# Patient Record
Sex: Male | Born: 2000 | Race: White | Hispanic: No | Marital: Single | Smoking: Never smoker
Health system: Southern US, Community
[De-identification: ages and names within clinical notes are randomized; demographics above are authoritative.]

## PROBLEM LIST (undated history)

## (undated) DIAGNOSIS — N1370 Vesicoureteral-reflux, unspecified: Secondary | ICD-10-CM

## (undated) DIAGNOSIS — Z973 Presence of spectacles and contact lenses: Secondary | ICD-10-CM

## (undated) DIAGNOSIS — G039 Meningitis, unspecified: Secondary | ICD-10-CM

## (undated) DIAGNOSIS — F909 Attention-deficit hyperactivity disorder, unspecified type: Secondary | ICD-10-CM

## (undated) HISTORY — DX: Vesicoureteral-reflux, unspecified: N13.70

## (undated) HISTORY — DX: Attention-deficit hyperactivity disorder, unspecified type: F90.9

## (undated) HISTORY — DX: Meningitis, unspecified: G03.9

## (undated) HISTORY — PX: NO PAST SURGERIES: SHX2092

## (undated) HISTORY — DX: Presence of spectacles and contact lenses: Z97.3

---
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
## 2020-08-26 ENCOUNTER — Telehealth: Payer: BLUE CROSS/BLUE SHIELD | Admitting: Physician Assistant

## 2020-08-26 DIAGNOSIS — L0291 Cutaneous abscess, unspecified: Secondary | ICD-10-CM

## 2020-08-26 MED ORDER — CLINDAMYCIN HCL 150 MG PO CAPS: 450.0000 mg | ORAL_CAPSULE | Freq: Three times a day (TID) | ORAL | 0 refills | Status: AC

## 2020-08-26 NOTE — Progress Notes (Signed)
E Visit for Cellulitis  We are sorry that you are not feeling well. Here is how we plan to help!  Based on what you shared with me it looks like you had an abscess that has turned into cellulitis.  Cellulitis looks like areas of skin redness, swelling, and warmth; it develops as a result of bacteria entering under the skin. Little red spots and/or bleeding can be seen in skin, and tiny surface sacs containing fluid can occur. Fever can be present. Cellulitis is almost always on one side of a body, and the lower limbs are the most common site of involvement.   I have prescribed:  Clindamycin 450 mg take one by mouth three times a day for 5 days  HOME CARE:  . Take your medications as ordered and take all of them, even if the skin irritation appears to be healing.   GET HELP RIGHT AWAY IF:  . Symptoms that don't begin to go away within 48 hours. . Severe redness persists or worsens . If the area turns color, spreads or swells. . If it blisters and opens, develops yellow-brown crust or bleeds. . You develop a fever or chills. . If the pain increases or becomes unbearable.  . Are unable to keep fluids and food down.  MAKE SURE YOU    Understand these instructions.  Will watch your condition.  Will get help right away if you are not doing well or get worse.  Thank you for choosing an e-visit. Your e-visit answers were reviewed by a board certified advanced clinical practitioner to complete your personal care plan. Depending upon the condition, your plan could have included both over the counter or prescription medications. Please review your pharmacy choice. Make sure the pharmacy is open so you can pick up prescription now. If there is a problem, you may contact your provider through [employer] and have the prescription routed to another pharmacy. Your safety is important to [place]. If you have drug allergies check your prescription carefully.  For the next 24 hours you can use  MyChart to ask questions about today's visit, request a non-urgent call back, or ask for a work or school excuse. You will get an email in the next two days asking about your experience. I hope that your e-visit has been valuable and will speed your recovery.   Greater than 5 minutes, yet less than 10 minutes of time have been spent researching, coordinating, and implementing care for this patient today

## 2021-05-08 ENCOUNTER — Ambulatory Visit: Payer: BLUE CROSS/BLUE SHIELD | Admitting: Family Medicine

## 2021-05-22 ENCOUNTER — Telehealth: Payer: Self-pay | Admitting: *Deleted

## 2021-05-22 ENCOUNTER — Ambulatory Visit: Payer: BLUE CROSS/BLUE SHIELD | Admitting: Family Medicine

## 2021-05-22 NOTE — Telephone Encounter (Signed)
Patient rescheduled to 06/08/21

## 2021-05-22 NOTE — Telephone Encounter (Signed)
Caller Name Brysen Shankman Caller Phone Number [phone] Patient Name David Gray Patient DOB 02-Apr-2001 Call Type Message Only Information Provided Reason for Call Request to [facility] Appointment Initial Comment Caller states he has an apt today but he does not feel well, needs to cancel. Patient request to speak to RN No Additional Comment Caller declined triage and office hours were provided. Disp. Time Disposition Final User 05/22/2021 7:14:16 AM General Information Provided Yes Schlegelmilch, Fleet Contras

## 2021-05-29 ENCOUNTER — Telehealth: Payer: Self-pay | Admitting: *Deleted

## 2021-05-29 NOTE — Telephone Encounter (Signed)
Who Is Calling Patient / Member / Family / Caregiver Call Type Triage / Clinical Relationship To Patient Self Return Phone Number [phone] (Primary) Chief Complaint Muscle Jerks, Tics And Shudders Reason for Call Medication Question / Request Initial Comment Caller has an appt scheduled. Caller has ADHD, he is requesting med from any provider. Unable to focusing, tics. Translation No Nurse Assessment Nurse: Elesa Hacker, RN, Nash Dimmer Date/Time (Eastern Time): 05/28/2021 7:01:21 PM Confirm and document reason for call. If symptomatic, describe symptoms. ---Caller has an appt scheduled. Caller has ADHD, he is requesting med from any provider. Unable to focusing, tics.

## 2021-05-29 NOTE — Telephone Encounter (Signed)
Spoke with Dr. Carmelia Roller and he said to put him in for Wednesday 06/03/21.  Patient scheduled for acute visit.

## 2021-06-03 ENCOUNTER — Encounter: Payer: Self-pay | Admitting: Family Medicine

## 2021-06-03 ENCOUNTER — Ambulatory Visit (INDEPENDENT_AMBULATORY_CARE_PROVIDER_SITE_OTHER): Payer: Medicaid Other | Admitting: Family Medicine

## 2021-06-03 ENCOUNTER — Other Ambulatory Visit: Payer: Self-pay

## 2021-06-03 VITALS — BP 118/82 | HR 97 | Temp 98.8°F | Ht 70.0 in | Wt 189.5 lb

## 2021-06-03 DIAGNOSIS — F902 Attention-deficit hyperactivity disorder, combined type: Secondary | ICD-10-CM | POA: Diagnosis not present

## 2021-06-03 DIAGNOSIS — G8929 Other chronic pain: Secondary | ICD-10-CM | POA: Diagnosis not present

## 2021-06-03 DIAGNOSIS — F951 Chronic motor or vocal tic disorder: Secondary | ICD-10-CM | POA: Diagnosis not present

## 2021-06-03 DIAGNOSIS — M545 Low back pain, unspecified: Secondary | ICD-10-CM | POA: Diagnosis not present

## 2021-06-03 MED ORDER — AMPHETAMINE-DEXTROAMPHETAMINE 20 MG PO TABS: 20.0000 mg | ORAL_TABLET | Freq: Every day | ORAL | 0 refills | Status: DC

## 2021-06-03 NOTE — Progress Notes (Signed)
Chief Complaint  Patient presents with  . ADHD  . New Patient (Initial Visit)       New Patient Visit SUBJECTIVE: HPI: David Gray is an 20 y.o.male who is being seen for establishing care.  The patient was previously seen at the [place].  ADHD The patient has a 15-year history of ADHD.  He was diagnosed when he was in first grade by his pediatrician who had him undergo formal evaluation with a psychologist.  He has tried Focalin, Adderall, Ritalin, and another medication which he cannot remember the name.  He got up in the [employer] 1 year ago.  He is taking classes currently and notices his schoolwork is challenging due to poor attention.  He also has some hyperactivity and takes.  He would like to be evaluated.  OCD.  He does partake with some illicit drug use but is willing to stop with restarting medication.  Low back pain The patient has a 1.5-year history of left-sided low back pain.  Lifting will sometimes make it worse.  There is no specific injury that started this.  He has tingling/numbness radiating down the posterior left thigh, but does not extend further than the knee.  No bowel or bladder incontinence.  He does not stretch routinely.  No other neurologic signs or symptoms.  No redness, bruising, or swelling.  Past Medical History:  Diagnosis Date  . ADHD (attention deficit hyperactivity disorder)    dx  at age 26 or 14  . Wears glasses    Past Surgical History:  Procedure Laterality Date  . NO PAST SURGERIES     Family History  Problem Relation Age of Onset  . Bipolar disorder Mother   . Drug abuse Mother   . Cancer Other        GGM breast ca  . Bipolar disorder Father   . Drug abuse Father   . Bipolar disorder Maternal Grandmother   . COPD Maternal Aunt   . Diabetes Neg Hx   . Coronary artery disease Neg Hx   . Colon cancer Neg Hx   . Prostate cancer Neg Hx    No Known Allergies  Current Outpatient Medications:  .  amphetamine-dextroamphetamine (ADDERALL)  20 MG tablet, Take 1 tablet (20 mg total) by mouth daily., Disp: 30 tablet, Rfl: 0  OBJECTIVE: BP 118/82 (BP Location: Left Arm, Patient Position: Sitting, Cuff Size: Normal)   Pulse 97   Temp 98.8 F (37.1 C) (Oral)   Ht 5\' 10"  (1.778 m)   Wt 189 lb 8 oz (86 kg)   SpO2 98%   BMI 27.19 kg/m  General:  well developed, well nourished, in no apparent distress Skin:  no significant moles, warts, or growths Lungs:  clear to auscultation, breath sounds equal bilaterally, no respiratory distress Cardio:  regular rate and rhythm, no LE edema or bruits Musculoskeletal: Tight hamstrings bilaterally, negative straight leg bilaterally, no tenderness to palpation over lumbar paraspinal musculature and erector spinae muscle group, no tenderness over the SI joints Neuro:  gait normal, DTRs equal and symmetric throughout, no clonus, no cerebellar signs Psych: well oriented with normal range of affect and appropriate judgment/insight  ASSESSMENT/PLAN: Attention deficit hyperactivity disorder (ADHD), combined type - Plan: Ambulatory referral to Psychology, amphetamine-dextroamphetamine (ADDERALL) 20 MG tablet  Chronic motor tic - Plan: Ambulatory referral to Psychology  Chronic left-sided low back pain without sciatica  1.  Chronic, uncontrolled.  Start Adderall 20 mg daily.  Stop all illicit drug use.  We will  have him sign a controlled substance contract and give [place] a urine drug screen sample when he is stable on the medication.  Refer to psychology for formal diagnosis of this versus OCD versus autism spectrum issue. 2.  As above 3.  Stretches and exercises provided.  If no improvement, will have him start physical therapy. Patient should return in 1 month to check above. The patient voiced understanding and agreement to the plan.   [name], DO 06/03/21  3:10 PM

## 2021-06-03 NOTE — Patient Instructions (Addendum)
If you do not hear anything about your referral in the next 1-2 weeks, call our office and ask for an update.  Let us know if you need anything.  EXERCISES  RANGE OF MOTION (ROM) AND STRETCHING EXERCISES - Low Back Pain Most people with lower back pain will find that their symptoms get worse with excessive bending forward (flexion) or arching at the lower back (extension). The exercises that will help resolve your symptoms will focus on the opposite motion.  If you have pain, numbness or tingling which travels down into your buttocks, leg or foot, the goal of the therapy is for these symptoms to move closer to your back and eventually resolve. Sometimes, these leg symptoms will get better, but your lower back pain may worsen. This is often an indication of progress in your rehabilitation. Be very alert to any changes in your symptoms and the activities in which you participated in the 24 hours prior to the change. Sharing this information with your caregiver will allow him or her to most efficiently treat your condition. These exercises may help you when beginning to rehabilitate your injury. Your symptoms may resolve with or without further involvement from your physician, physical therapist or athletic trainer. While completing these exercises, remember:   Restoring tissue flexibility helps normal motion to return to the joints. This allows healthier, less painful movement and activity.  An effective stretch should be held for at least 30 seconds.  A stretch should never be painful. You should only feel a gentle lengthening or release in the stretched tissue. FLEXION RANGE OF MOTION AND STRETCHING EXERCISES:  STRETCH - Flexion, Single Knee to Chest   Lie on a firm bed or floor with both legs extended in front of you.  Keeping one leg in contact with the floor, bring your opposite knee to your chest. Hold your leg in place by either grabbing behind your thigh or at your knee.  Pull until you  feel a gentle stretch in your low back. Hold 30 seconds.  Slowly release your grasp and repeat the exercise with the opposite side. Repeat 2 times. Complete this exercise 3 times per week.   STRETCH - Flexion, Double Knee to Chest  Lie on a firm bed or floor with both legs extended in front of you.  Keeping one leg in contact with the floor, bring your opposite knee to your chest.  Tense your stomach muscles to support your back and then lift your other knee to your chest. Hold your legs in place by either grabbing behind your thighs or at your knees.  Pull both knees toward your chest until you feel a gentle stretch in your low back. Hold 30 seconds.  Tense your stomach muscles and slowly return one leg at a time to the floor. Repeat 2 times. Complete this exercise 3 times per week.   STRETCH - Low Trunk Rotation  Lie on a firm bed or floor. Keeping your legs in front of you, bend your knees so they are both pointed toward the ceiling and your feet are flat on the floor.  Extend your arms out to the side. This will stabilize your upper body by keeping your shoulders in contact with the floor.  Gently and slowly drop both knees together to one side until you feel a gentle stretch in your low back. Hold for 30 seconds.  Tense your stomach muscles to support your lower back as you bring your knees back to the starting position.  Repeat the exercise to the other side. Repeat 2 times. Complete this exercise at least 3 times per week.   EXTENSION RANGE OF MOTION AND FLEXIBILITY EXERCISES:  STRETCH - Extension, Prone on Elbows   Lie on your stomach on the floor, a bed will be too soft. Place your palms about shoulder width apart and at the height of your head.  Place your elbows under your shoulders. If this is too painful, stack pillows under your chest.  Allow your body to relax so that your hips drop lower and make contact more completely with the floor.  Hold this position for 30  seconds.  Slowly return to lying flat on the floor. Repeat 2 times. Complete this exercise 3 times per week.   RANGE OF MOTION - Extension, Prone Press Ups  Lie on your stomach on the floor, a bed will be too soft. Place your palms about shoulder width apart and at the height of your head.  Keeping your back as relaxed as possible, slowly straighten your elbows while keeping your hips on the floor. You may adjust the placement of your hands to maximize your comfort. As you gain motion, your hands will come more underneath your shoulders.  Hold this position 30 seconds.  Slowly return to lying flat on the floor. Repeat 2 times. Complete this exercise 3 times per week.   RANGE OF MOTION- Quadruped, Neutral Spine   Assume a hands and knees position on a firm surface. Keep your hands under your shoulders and your knees under your hips. You may place padding under your knees for comfort.  Drop your head and point your tailbone toward the ground below you. This will round out your lower back like an angry cat. Hold this position for 30 seconds.  Slowly lift your head and release your tail bone so that your back sags into a large arch, like an old horse.  Hold this position for 30 seconds.  Repeat this until you feel limber in your low back.  Now, find your "sweet spot." This will be the most comfortable position somewhere between the two previous positions. This is your neutral spine. Once you have found this position, tense your stomach muscles to support your low back.  Hold this position for 30 seconds. Repeat 2 times. Complete this exercise 3 times per week.   STRENGTHENING EXERCISES - Low Back Sprain These exercises may help you when beginning to rehabilitate your injury. These exercises should be done near your "sweet spot." This is the neutral, low-back arch, somewhere between fully rounded and fully arched, that is your least painful position. When performed in this safe range of  motion, these exercises can be used for people who have either a flexion or extension based injury. These exercises may resolve your symptoms with or without further involvement from your physician, physical therapist or athletic trainer. While completing these exercises, remember:   Muscles can gain both the endurance and the strength needed for everyday activities through controlled exercises.  Complete these exercises as instructed by your physician, physical therapist or athletic trainer. Increase the resistance and repetitions only as guided.  You may experience muscle soreness or fatigue, but the pain or discomfort you are trying to eliminate should never worsen during these exercises. If this pain does worsen, stop and make certain you are following the directions exactly. If the pain is still present after adjustments, discontinue the exercise until you can discuss the trouble with your caregiver.  STRENGTHENING -   Deep Abdominals, Pelvic Tilt   Lie on a firm bed or floor. Keeping your legs in front of you, bend your knees so they are both pointed toward the ceiling and your feet are flat on the floor.  Tense your lower abdominal muscles to press your low back into the floor. This motion will rotate your pelvis so that your tail bone is scooping upwards rather than pointing at your feet or into the floor. With a gentle tension and even breathing, hold this position for 3 seconds. Repeat 2 times. Complete this exercise 3 times per week.   STRENGTHENING - Abdominals, Crunches   Lie on a firm bed or floor. Keeping your legs in front of you, bend your knees so they are both pointed toward the ceiling and your feet are flat on the floor. Cross your arms over your chest.  Slightly tip your chin down without bending your neck.  Tense your abdominals and slowly lift your trunk high enough to just clear your shoulder blades. Lifting higher can put excessive stress on the lower back and does not  further strengthen your abdominal muscles.  Control your return to the starting position. Repeat 2 times. Complete this exercise 3 times per week.   STRENGTHENING - Quadruped, Opposite UE/LE Lift   Assume a hands and knees position on a firm surface. Keep your hands under your shoulders and your knees under your hips. You may place padding under your knees for comfort.  Find your neutral spine and gently tense your abdominal muscles so that you can maintain this position. Your shoulders and hips should form a rectangle that is parallel with the floor and is not twisted.  Keeping your trunk steady, lift your right hand no higher than your shoulder and then your left leg no higher than your hip. Make sure you are not holding your breath. Hold this position for 30 seconds.  Continuing to keep your abdominal muscles tense and your back steady, slowly return to your starting position. Repeat with the opposite arm and leg. Repeat 2 times. Complete this exercise 3 times per week.   STRENGTHENING - Abdominals and Quadriceps, Straight Leg Raise   Lie on a firm bed or floor with both legs extended in front of you.  Keeping one leg in contact with the floor, bend the other knee so that your foot can rest flat on the floor.  Find your neutral spine, and tense your abdominal muscles to maintain your spinal position throughout the exercise.  Slowly lift your straight leg off the floor about 6 inches for a count of 3, making sure to not hold your breath.  Still keeping your neutral spine, slowly lower your leg all the way to the floor. Repeat this exercise with each leg 2 times. Complete this exercise 3 times per week.  POSTURE AND BODY MECHANICS CONSIDERATIONS - Low Back Sprain Keeping correct posture when sitting, standing or completing your activities will reduce the stress put on different body tissues, allowing injured tissues a chance to heal and limiting painful experiences. The following are  general guidelines for improved posture.  While reading these guidelines, remember:  The exercises prescribed by your provider will help you have the flexibility and strength to maintain correct postures.  The correct posture provides the best environment for your joints to work. All of your joints have less wear and tear when properly supported by a spine with good posture. This means you will experience a healthier, less painful body.    Correct posture must be practiced with all of your activities, especially prolonged sitting and standing. Correct posture is as important when doing repetitive low-stress activities (typing) as it is when doing a single heavy-load activity (lifting).  RESTING POSITIONS Consider which positions are most painful for you when choosing a resting position. If you have pain with flexion-based activities (sitting, bending, stooping, squatting), choose a position that allows you to rest in a less flexed posture. You would want to avoid curling into a fetal position on your side. If your pain worsens with extension-based activities (prolonged standing, working overhead), avoid resting in an extended position such as sleeping on your stomach. Most people will find more comfort when they rest with their spine in a more neutral position, neither too rounded nor too arched. Lying on a non-sagging bed on your side with a pillow between your knees, or on your back with a pillow under your knees will often provide some relief. Keep in mind, being in any one position for a prolonged period of time, no matter how correct your posture, can still lead to stiffness.  PROPER SITTING POSTURE In order to minimize stress and discomfort on your spine, you must sit with correct posture. Sitting with good posture should be effortless for a healthy body. Returning to good posture is a gradual process. Many people can work toward this most comfortably by using various supports until they have the  flexibility and strength to maintain this posture on their own. When sitting with proper posture, your ears will fall over your shoulders and your shoulders will fall over your hips. You should use the back of the chair to support your upper back. Your lower back will be in a neutral position, just slightly arched. You may place a small pillow or folded towel at the base of your lower back for  support.  When working at a desk, create an environment that supports good, upright posture. Without extra support, muscles tire, which leads to excessive strain on joints and other tissues. Keep these recommendations in mind:  CHAIR:  A chair should be able to slide under your desk when your back makes contact with the back of the chair. This allows you to work closely.  The chair's height should allow your eyes to be level with the upper part of your monitor and your hands to be slightly lower than your elbows.  BODY POSITION  Your feet should make contact with the floor. If this is not possible, use a foot rest.  Keep your ears over your shoulders. This will reduce stress on your neck and low back.  INCORRECT SITTING POSTURES  If you are feeling tired and unable to assume a healthy sitting posture, do not slouch or slump. This puts excessive strain on your back tissues, causing more damage and pain. Healthier options include:  Using more support, like a lumbar pillow.  Switching tasks to something that requires you to be upright or walking.  Talking a brief walk.  Lying down to rest in a neutral-spine position.  PROLONGED STANDING WHILE SLIGHTLY LEANING FORWARD  When completing a task that requires you to lean forward while standing in one place for a long time, place either foot up on a stationary 2-4 inch high object to help maintain the best posture. When both feet are on the ground, the lower back tends to lose its slight inward curve. If this curve flattens (or becomes too large), then the  back and your other joints  will experience too much stress, tire more quickly, and can cause pain.  CORRECT STANDING POSTURES Proper standing posture should be assumed with all daily activities, even if they only take a few moments, like when brushing your teeth. As in sitting, your ears should fall over your shoulders and your shoulders should fall over your hips. You should keep a slight tension in your abdominal muscles to brace your spine. Your tailbone should point down to the ground, not behind your body, resulting in an over-extended swayback posture.   INCORRECT STANDING POSTURES  Common incorrect standing postures include a forward head, locked knees and/or an excessive swayback. WALKING Walk with an upright posture. Your ears, shoulders and hips should all line-up.  PROLONGED ACTIVITY IN A FLEXED POSITION When completing a task that requires you to bend forward at your waist or lean over a low surface, try to find a way to stabilize 3 out of 4 of your limbs. You can place a hand or elbow on your thigh or rest a knee on the surface you are reaching across. This will provide you more stability, so that your muscles do not tire as quickly. By keeping your knees relaxed, or slightly bent, you will also reduce stress across your lower back. CORRECT LIFTING TECHNIQUES  DO :  Assume a wide stance. This will provide you more stability and the opportunity to get as close as possible to the object which you are lifting.  Tense your abdominals to brace your spine. Bend at the knees and hips. Keeping your back locked in a neutral-spine position, lift using your leg muscles. Lift with your legs, keeping your back straight.  Test the weight of unknown objects before attempting to lift them.  Try to keep your elbows locked down at your sides in order get the best strength from your shoulders when carrying an object.     Always ask for help when lifting heavy or awkward objects. INCORRECT  LIFTING TECHNIQUES DO NOT:   Lock your knees when lifting, even if it is a small object.  Bend and twist. Pivot at your feet or move your feet when needing to change directions.  Assume that you can safely pick up even a paperclip without proper posture.

## 2021-06-08 ENCOUNTER — Ambulatory Visit: Payer: BLUE CROSS/BLUE SHIELD | Admitting: Family Medicine

## 2021-06-22 ENCOUNTER — Telehealth: Payer: Self-pay | Admitting: *Deleted

## 2021-06-22 NOTE — Telephone Encounter (Signed)
Who Is Calling Patient / Member / Family / Caregiver Call Type Triage / Clinical Caller Name Domenica Reamer Relationship To Patient Mother Return Phone Number [phone] (Primary) Chief Complaint BREATHING - shortness of breath or sounds breathless Reason for Call Symptomatic / Request for Health Information Initial Comment Caller states her stepson's grandmother passed away in a car accident yesterday. He is having a lot of anxiety, no suicidal thoughts per caller. He has a feeling where he cannot breathe. She is looking for some medication to help him. Translation No Nurse Assessment Nurse: Reasor, RN, Leah Date/Time (Eastern Time): 06/20/2021 10:53:53 AM Confirm and document reason for call. If symptomatic, describe symptoms. ---Caller states pt's grandmother passed away in a car accident yesterday. Caller states pt is having a lot of anxiety, no suicidal thoughts per caller. He has a feeling where he cannot breathe   See pcp in 24 hours

## 2021-07-03 ENCOUNTER — Encounter: Payer: Self-pay | Admitting: Family Medicine

## 2021-07-03 ENCOUNTER — Other Ambulatory Visit: Payer: Self-pay

## 2021-07-03 ENCOUNTER — Ambulatory Visit (INDEPENDENT_AMBULATORY_CARE_PROVIDER_SITE_OTHER): Payer: Medicaid Other | Admitting: Family Medicine

## 2021-07-03 VITALS — BP 102/62 | HR 67 | Temp 98.7°F | Ht 71.0 in | Wt 184.2 lb

## 2021-07-03 DIAGNOSIS — Z79899 Other long term (current) drug therapy: Secondary | ICD-10-CM

## 2021-07-03 DIAGNOSIS — F902 Attention-deficit hyperactivity disorder, combined type: Secondary | ICD-10-CM

## 2021-07-03 MED ORDER — AMPHETAMINE-DEXTROAMPHETAMINE 20 MG PO TABS: 20.0000 mg | ORAL_TABLET | Freq: Every day | ORAL | 0 refills | Status: DC

## 2021-07-03 MED ORDER — AMPHETAMINE-DEXTROAMPHETAMINE 20 MG PO TABS: 20.0000 mg | ORAL_TABLET | Freq: Two times a day (BID) | ORAL | 0 refills | Status: DC

## 2021-07-03 NOTE — Progress Notes (Signed)
Chief Complaint  Patient presents with   Follow-up    medication    David Gray is 20 y.o. male here for ADHD follow up.  Patient is currently on Adderall 20 mg/d and compliance is excellent. Symptoms are well controlled. . Side effects include some tics. Patient believes their dose should be unchanged. Denies weight loss, difficulties with sleep, self-medication, alcohol/drug abuse, chest pain, or palpitations.   Past Medical History:  Diagnosis Date   ADHD (attention deficit hyperactivity disorder)    dx  at age 40 or 5   Wears glasses     BP 102/62   Pulse 67   Temp 98.7 F (37.1 C) (Oral)   Ht 5\' 11"  (1.803 m)   Wt 184 lb 4 oz (83.6 kg)   SpO2 98%   BMI 25.70 kg/m  Gen- awake, alert, appearing stated age Heart- RRR Lungs- CTAB, no accessory muscle use Neuro- no facial tics Psych- age appropriate judgment and insight, normal mood and affect  Attention deficit hyperactivity disorder (ADHD), combined type - Plan: amphetamine-dextroamphetamine (ADDERALL) 20 MG tablet, amphetamine-dextroamphetamine (ADDERALL) 20 MG tablet, amphetamine-dextroamphetamine (ADDERALL) 20 MG tablet  Chronic, stable. Cont Adderall 20 mg/d. CSC and UDS today.  F/u in 6 mo for CPE. Pt voiced understanding and agreement to the plan.  Foxx Klarich Fairplay, DO 07/03/21 2:13 PM

## 2021-07-03 NOTE — Patient Instructions (Signed)
Use Neosporin twice daily when your skin starts to flare up.  Let us know if you need anything.

## 2021-07-08 LAB — DRUG MONITORING, PANEL 8 WITH CONFIRMATION, URINE
6 Acetylmorphine: NEGATIVE ng/mL (ref <10)
Alcohol Metabolites: NEGATIVE ng/mL (ref <500)
Amphetamines: NEGATIVE ng/mL (ref <500)
Benzodiazepines: NEGATIVE ng/mL (ref <100)
Buprenorphine, Urine: NEGATIVE ng/mL (ref <5)
Cocaine Metabolite: NEGATIVE ng/mL (ref <150)
Creatinine: 43.9 mg/dL (ref >=20.0)
MDMA: NEGATIVE ng/mL (ref <500)
Marijuana Metabolite: NEGATIVE ng/mL (ref <20)
Opiates: NEGATIVE ng/mL (ref <100)
Oxidant: NEGATIVE ug/mL (ref <200)
Oxycodone: NEGATIVE ng/mL (ref <100)
pH: 6.4 (ref 4.5–9.0)

## 2021-07-08 LAB — DM TEMPLATE

## 2021-08-07 ENCOUNTER — Emergency Department (HOSPITAL_BASED_OUTPATIENT_CLINIC_OR_DEPARTMENT_OTHER): Payer: Medicaid Other

## 2021-08-07 ENCOUNTER — Other Ambulatory Visit: Payer: Self-pay

## 2021-08-07 ENCOUNTER — Telehealth: Payer: Self-pay | Admitting: *Deleted

## 2021-08-07 ENCOUNTER — Emergency Department (HOSPITAL_BASED_OUTPATIENT_CLINIC_OR_DEPARTMENT_OTHER)
Admission: EM | Admit: 2021-08-07 | Discharge: 2021-08-07 | Disposition: A | Discharge status: Home / Self Care | Payer: Medicaid Other | Attending: Emergency Medicine | Admitting: Emergency Medicine

## 2021-08-07 ENCOUNTER — Encounter (HOSPITAL_BASED_OUTPATIENT_CLINIC_OR_DEPARTMENT_OTHER): Payer: Self-pay | Admitting: Emergency Medicine

## 2021-08-07 DIAGNOSIS — R079 Chest pain, unspecified: Secondary | ICD-10-CM | POA: Diagnosis present

## 2021-08-07 DIAGNOSIS — Z20822 Contact with and (suspected) exposure to covid-19: Secondary | ICD-10-CM | POA: Insufficient documentation

## 2021-08-07 DIAGNOSIS — R0789 Other chest pain: Secondary | ICD-10-CM | POA: Insufficient documentation

## 2021-08-07 LAB — RESP PANEL BY RT-PCR (FLU A&B, COVID) ARPGX2
Influenza A by PCR: NEGATIVE
Influenza B by PCR: NEGATIVE
SARS Coronavirus 2 by RT PCR: NEGATIVE

## 2021-08-07 LAB — TROPONIN I (HIGH SENSITIVITY): Troponin I (High Sensitivity): <2 ng/L (ref <18)

## 2021-08-07 MED ORDER — KETOROLAC TROMETHAMINE 30 MG/ML IJ SOLN
30.0000 mg | Freq: Once | INTRAMUSCULAR | Status: AC
  Administered 2021-08-07: 30 mg via INTRAMUSCULAR
  Filled 2021-08-07: qty 1

## 2021-08-07 NOTE — Telephone Encounter (Signed)
Who Is Calling Patient / Member / Family / Caregiver Call Type Triage / Clinical Relationship To Patient Self Return Phone Number [phone] (Secondary) Chief Complaint CHEST PAIN - pain, pressure, heaviness or tightness Reason for Call Symptomatic / Request for Health Information Initial Comment Caller states he was prescribed Adderall and he is now having chest pain. States he has anxiety and is worried. If he breathes out deep or bend forward he feels it. Translation No Nurse Assessment Nurse: Dante Gang, RN, Dossie Arbour Date/Time (Eastern Time): 08/07/2021 12:32:26 AM Confirm and document reason for call. If symptomatic, describe symptoms. ---Caller states that he was prescribed Adderall and now he is having chest pain. He took the Adderall at 9:00 pm tonight. 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 He has been on it for only about 2 months. This is the first time with the chest pain. He also has Anxiety. Pain is worse than when breathes out deep and when he leans forward. This is a new prescription and the pills look different than the last ones  08/07/2021 12:44:36 AM Go to ED Now (or PCP triage) Yes Dante Gang, RN, Dossie Arbour

## 2021-08-07 NOTE — Telephone Encounter (Signed)
FYI  Patient went to ED 

## 2021-08-07 NOTE — ED Triage Notes (Signed)
Pt states chest pain  left center. started about 10 pm 8/11. No injury no cough. States took adderall. 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 States has tingling in central left of mid back.
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
## 2021-09-21 ENCOUNTER — Other Ambulatory Visit: Payer: Self-pay

## 2021-09-21 ENCOUNTER — Encounter (HOSPITAL_BASED_OUTPATIENT_CLINIC_OR_DEPARTMENT_OTHER): Payer: Self-pay

## 2021-09-21 DIAGNOSIS — Z5321 Procedure and treatment not carried out due to patient leaving prior to being seen by health care provider: Secondary | ICD-10-CM | POA: Diagnosis not present

## 2021-09-21 DIAGNOSIS — R109 Unspecified abdominal pain: Secondary | ICD-10-CM | POA: Diagnosis present

## 2021-09-21 LAB — COMPREHENSIVE METABOLIC PANEL
ALT: 19 U/L (ref 0–44)
AST: 18 U/L (ref 15–41)
Albumin: 4.6 g/dL (ref 3.5–5.0)
Alkaline Phosphatase: 67 U/L (ref 38–126)
Anion gap: 8 (ref 5–15)
BUN: 11 mg/dL (ref 6–20)
CO2: 30 mmol/L (ref 22–32)
Calcium: 9.6 mg/dL (ref 8.9–10.3)
Chloride: 98 mmol/L (ref 98–111)
Creatinine, Ser: 1.04 mg/dL (ref 0.61–1.24)
GFR, Estimated: >60 mL/min (ref >60)
Glucose, Bld: 103 mg/dL — ABNORMAL HIGH (ref 70–99)
Potassium: 4.1 mmol/L (ref 3.5–5.1)
Sodium: 136 mmol/L (ref 135–145)
Total Bilirubin: 0.6 mg/dL (ref 0.3–1.2)
Total Protein: 8.4 g/dL — ABNORMAL HIGH (ref 6.5–8.1)

## 2021-09-21 LAB — CBC
HCT: 46.6 % (ref 39.0–52.0)
Hemoglobin: 16.3 g/dL (ref 13.0–17.0)
MCH: 29.3 pg (ref 26.0–34.0)
MCHC: 35 g/dL (ref 30.0–36.0)
MCV: 83.8 fL (ref 80.0–100.0)
Platelets: 277 10*3/uL (ref 150–400)
RBC: 5.56 MIL/uL (ref 4.22–5.81)
RDW: 13.3 % (ref 11.5–15.5)
WBC: 9.3 10*3/uL (ref 4.0–10.5)
nRBC: 0 % (ref 0.0–0.2)

## 2021-09-21 LAB — LIPASE, BLOOD: Lipase: 33 U/L (ref 11–51)

## 2021-09-21 NOTE — ED Triage Notes (Signed)
Pt c/o abd pain x 1 week-denies v/d-NAD-steady gait

## 2021-09-22 ENCOUNTER — Emergency Department (HOSPITAL_BASED_OUTPATIENT_CLINIC_OR_DEPARTMENT_OTHER)
Admission: EM | Admit: 2021-09-22 | Discharge: 2021-09-22 | Disposition: A | Discharge status: Home / Self Care | Payer: Medicaid Other | Attending: Emergency Medicine | Admitting: Emergency Medicine

## 2021-09-29 ENCOUNTER — Other Ambulatory Visit: Payer: Self-pay | Admitting: Family Medicine

## 2021-09-29 DIAGNOSIS — F902 Attention-deficit hyperactivity disorder, combined type: Secondary | ICD-10-CM

## 2021-09-29 MED ORDER — AMPHETAMINE-DEXTROAMPHETAMINE 20 MG PO TABS: 20.0000 mg | ORAL_TABLET | Freq: Two times a day (BID) | ORAL | 0 refills | Status: DC

## 2021-09-29 MED ORDER — AMPHETAMINE-DEXTROAMPHETAMINE 20 MG PO TABS: 20.0000 mg | ORAL_TABLET | Freq: Every day | ORAL | 0 refills | Status: DC

## 2021-09-29 NOTE — Telephone Encounter (Signed)
Medication:  amphetamine-dextroamphetamine (ADDERALL) 20 MG tablet   Has the patient contacted their pharmacy? No. (If no, request that the patient contact the pharmacy for the refill.) (If yes, when and what did the pharmacy advise?)    Preferred Pharmacy (with phone number or street name):  [id number] - [place], [place] - [address]  [address], [place] [address]  Phone:  [phone]  Fax:  [phone]  

## 2021-09-29 NOTE — Telephone Encounter (Signed)
Requesting:addreall Contract:07/14/2021 UDS:07/03/2021 Last Visit:07/03/2021 Next Visit:01/05/2022 Last Refill:09/01/2021  Please Advise

## 2021-10-01 ENCOUNTER — Telehealth: Payer: Self-pay | Admitting: Family Medicine

## 2021-10-01 NOTE — Telephone Encounter (Signed)
Spoke with the pharmacy and they said its a nationwide backorder , and they haven't had any in the past 2 weeks   Alternatives:  Adderrall Time released capsules & ritalin

## 2021-10-01 NOTE — Telephone Encounter (Signed)
Patient called on 10.04 for med refill amphetamine-dextroamphetamine (ADDERALL) 20 MG tablet. Med request was submitted. However there is a shortage within the pharmacy & advised patient to request alternate meds.   [id number] - [place], [place] - [address] AT [place] [address]  [address], [place] [address]  Phone:  [phone]  Fax:  [phone]

## 2021-10-02 MED ORDER — METHYLPHENIDATE HCL 10 MG PO TABS: 10.0000 mg | ORAL_TABLET | Freq: Every day | ORAL | 0 refills | Status: DC

## 2021-12-29 ENCOUNTER — Telehealth: Payer: Self-pay | Admitting: Family Medicine

## 2021-12-29 DIAGNOSIS — F902 Attention-deficit hyperactivity disorder, combined type: Secondary | ICD-10-CM

## 2021-12-29 MED ORDER — AMPHETAMINE-DEXTROAMPHETAMINE 20 MG PO TABS: 20.0000 mg | ORAL_TABLET | Freq: Every day | ORAL | 0 refills | Status: DC

## 2021-12-29 MED ORDER — AMPHETAMINE-DEXTROAMPHETAMINE 20 MG PO TABS: 20.0000 mg | ORAL_TABLET | Freq: Two times a day (BID) | ORAL | 0 refills | Status: DC

## 2021-12-29 NOTE — Telephone Encounter (Signed)
Last Refill on 11/28/2021 Last OV---07/03/21 Next upcoming scheduled OV---01/05/2022

## 2021-12-29 NOTE — Telephone Encounter (Signed)
Medication:  °amphetamine-dextroamphetamine (ADDERALL) 20 MG tablet  °Has the patient contacted their pharmacy? No. °(If no, request that the patient contact the pharmacy for the refill.) °(If yes, when and what did the pharmacy advise?) ° °Preferred Pharmacy (with phone number or street name):  °[id number] - [place], [place] - [address]  °[address], [place] [address]  °Phone:  [phone]  Fax:  [phone]  ° °Agent: Please be advised that RX refills may take up to 3 business days. We ask that you follow-up with your pharmacy.  °

## 2022-01-05 ENCOUNTER — Encounter: Payer: Self-pay | Admitting: Family Medicine

## 2022-03-01 ENCOUNTER — Telehealth: Payer: Self-pay | Admitting: Family Medicine

## 2022-03-01 ENCOUNTER — Telehealth: Payer: Self-pay

## 2022-03-01 MED ORDER — METHYLPHENIDATE HCL ER (LA) 20 MG PO CP24: 20.0000 mg | ORAL_CAPSULE | ORAL | 0 refills | Status: DC

## 2022-03-01 NOTE — Telephone Encounter (Signed)
PA approved.  ? ?Request Reference Number: [id number]. METHYLPHENID CAP 20MG  ER is approved through 03/02/2023. For further questions, call 05/02/2023 at [phone]. ?

## 2022-03-01 NOTE — Telephone Encounter (Signed)
PA initiated via Covermymeds; KEY: [id number]. Awaiting determination.  ?

## 2022-03-01 NOTE — Telephone Encounter (Signed)
Patient states his pharmacy is out of Adderall and would like to know if something else can be sent. Please advise.  ? ?[id number] - [place], [place] - [address]  ?[address], [place] [address]  ?Phone:  [phone]  Fax:  [phone] ?

## 2022-03-15 ENCOUNTER — Telehealth: Payer: Medicaid Other | Admitting: Physician Assistant

## 2022-03-15 DIAGNOSIS — J019 Acute sinusitis, unspecified: Secondary | ICD-10-CM | POA: Diagnosis not present

## 2022-03-15 DIAGNOSIS — J208 Acute bronchitis due to other specified organisms: Secondary | ICD-10-CM | POA: Diagnosis not present

## 2022-03-15 DIAGNOSIS — B9689 Other specified bacterial agents as the cause of diseases classified elsewhere: Secondary | ICD-10-CM | POA: Diagnosis not present

## 2022-03-15 MED ORDER — BENZONATATE 100 MG PO CAPS
100.0000 mg | ORAL_CAPSULE | Freq: Three times a day (TID) | ORAL | 0 refills | Status: DC | PRN
Start: 2022-03-15 — End: 2022-04-05

## 2022-03-15 MED ORDER — DOXYCYCLINE HYCLATE 100 MG PO TABS: 100.0000 mg | ORAL_TABLET | Freq: Two times a day (BID) | ORAL | 0 refills | Status: DC

## 2022-03-15 MED ORDER — PREDNISONE 10 MG (21) PO TBPK: ORAL_TABLET | ORAL | 0 refills | Status: DC

## 2022-03-15 NOTE — Progress Notes (Signed)
We are sorry that you are not feeling well.  Here is how we plan to help! ? ?Based on your presentation I believe you most likely have A cough due to bacteria.  When patients have a fever and a productive cough with a change in color or increased sputum production, we are concerned about bacterial bronchitis.  If left untreated it can progress to pneumonia.  If your symptoms do not improve with your treatment plan it is important that you contact your provider.   I have prescribed Doxycycline 100 mg twice a day for 7 days   ? ?I do believe you may also have a sinus infection. The above antibiotic will cover both. ?  ?In addition you may use A prescription cough medication called Tessalon Perles 100mg . You may take 1-2 capsules every 8 hours as needed for your cough. ? ?Prednisone 10 mg daily for 6 days (see taper instructions below) ? ?Directions for 6 day taper: ?Day 1: 2 tablets before breakfast, 1 after both lunch & dinner and 2 at bedtime ?Day 2: 1 tab before breakfast, 1 after both lunch & dinner and 2 at bedtime ?Day 3: 1 tab at each meal & 1 at bedtime ?Day 4: 1 tab at breakfast, 1 at lunch, 1 at bedtime ?Day 5: 1 tab at breakfast & 1 tab at bedtime ?Day 6: 1 tab at breakfast ? ?From your responses in the eVisit questionnaire you describe inflammation in the upper respiratory tract which is causing a significant cough.  This is commonly called Bronchitis and has four common causes:   ?Allergies ?Viral Infections ?Acid Reflux ?Bacterial Infection ?Allergies, viruses and acid reflux are treated by controlling symptoms or eliminating the cause. An example might be a cough caused by taking certain blood pressure medications. You stop the cough by changing the medication. Another example might be a cough caused by acid reflux. Controlling the reflux helps control the cough. ? ?USE OF BRONCHODILATOR ("RESCUE") INHALERS: ?There is a risk from using your bronchodilator too frequently.  The risk is that over-reliance  on a medication which only relaxes the muscles surrounding the breathing tubes can reduce the effectiveness of medications prescribed to reduce swelling and congestion of the tubes themselves.  Although you feel brief relief from the bronchodilator inhaler, your asthma may actually be worsening with the tubes becoming more swollen and filled with mucus.  This can delay other crucial treatments, such as oral steroid medications. If you need to use a bronchodilator inhaler daily, several times per day, you should discuss this with your provider.  There are probably better treatments that could be used to keep your asthma under control.  ?   ?HOME CARE ?Only take medications as instructed by your medical team. ?Complete the entire course of an antibiotic. ?Drink plenty of fluids and get plenty of rest. ?Avoid close contacts especially the very young and the elderly ?Cover your mouth if you cough or cough into your sleeve. ?Always remember to wash your hands ?A steam or ultrasonic humidifier can help congestion.  ? ?GET HELP RIGHT AWAY IF: ?You develop worsening fever. ?You become short of breath ?You cough up blood. ?Your symptoms persist after you have completed your treatment plan ?MAKE SURE YOU  ?Understand these instructions. ?Will watch your condition. ?Will get help right away if you are not doing well or get worse. ?  ? ?Thank you for choosing an e-visit. ? ?Your e-visit answers were reviewed by a board certified advanced clinical practitioner to  complete your personal care plan. Depending upon the condition, your plan could have included both over the counter or prescription medications. ? ?Please review your pharmacy choice. Make sure the pharmacy is open so you can pick up prescription now. If there is a problem, you may contact your provider through [employer] and have the prescription routed to another pharmacy.  Your safety is important to [place]. If you have drug allergies check your prescription  carefully.  ? ?For the next 24 hours you can use MyChart to ask questions about today's visit, request a non-urgent call back, or ask for a work or school excuse. ?You will get an email in the next two days asking about your experience. I hope that your e-visit has been valuable and will speed your recovery. ? ?I provided 5 minutes of non face-to-face time during this encounter for chart review and documentation.  ? ?

## 2022-03-16 MED ORDER — PROMETHAZINE-DM 6.25-15 MG/5ML PO SYRP: 5.0000 mL | ORAL_SOLUTION | Freq: Four times a day (QID) | ORAL | 0 refills | Status: DC | PRN

## 2022-03-16 NOTE — Addendum Note (Signed)
Addended by: Margaretann Loveless on: 03/16/2022 05:53 PM ? ? Modules accepted: Orders ? ?

## 2022-04-04 ENCOUNTER — Other Ambulatory Visit: Payer: Self-pay | Admitting: Family Medicine

## 2022-04-04 DIAGNOSIS — F902 Attention-deficit hyperactivity disorder, combined type: Secondary | ICD-10-CM

## 2022-04-05 ENCOUNTER — Other Ambulatory Visit: Payer: Self-pay | Admitting: Family Medicine

## 2022-04-05 ENCOUNTER — Telehealth: Payer: Self-pay | Admitting: Family Medicine

## 2022-04-05 DIAGNOSIS — F902 Attention-deficit hyperactivity disorder, combined type: Secondary | ICD-10-CM

## 2022-04-05 MED ORDER — AMPHETAMINE-DEXTROAMPHETAMINE 20 MG PO TABS: 20.0000 mg | ORAL_TABLET | Freq: Every day | ORAL | 0 refills | Status: DC

## 2022-04-05 NOTE — Telephone Encounter (Signed)
Called and scheduled appt

## 2022-04-05 NOTE — Telephone Encounter (Signed)
30 d sent, due for 6 mo visit. Ty.  ?

## 2022-04-05 NOTE — Telephone Encounter (Signed)
Last OV--07/03/21 ?Last RF--02/17/22 ?UDS--02/17/22 ?

## 2022-04-05 NOTE — Telephone Encounter (Signed)
Medication:  ?amphetamine-dextroamphetamine (ADDERALL) 20 MG tablet [[id number]]  ?  ? ?Has the patient contacted their pharmacy? No. ?(If no, request that the patient contact the pharmacy for the refill.) ?(If yes, when and what did the pharmacy advise?) ? ?  ? ?Preferred Pharmacy (with phone number or street name):  ?[id number] - [place], [place] - [address]  ?[address], [place] [address]  ?Phone:  [phone]  Fax:  [phone]  ?  ? ?Agent: Please be advised that RX refills may take up to 3 business days. We ask that you follow-up with your pharmacy.  ? ?Pt stated if pharmacy is out of the medication to please send alternate medication. ?

## 2022-04-05 NOTE — Telephone Encounter (Signed)
Request sent in earlier ? ?

## 2022-04-09 ENCOUNTER — Encounter: Payer: Self-pay | Admitting: Family Medicine

## 2022-04-09 ENCOUNTER — Ambulatory Visit: Payer: Medicaid Other | Admitting: Family Medicine

## 2022-04-28 ENCOUNTER — Telehealth: Payer: Self-pay | Admitting: Family Medicine

## 2022-04-28 NOTE — Telephone Encounter (Signed)
Medication: amphetamine-dextroamphetamine (ADDERALL) 20 MG tablet  ? ?Has the patient contacted their pharmacy? Yes.   ? ? ?Preferred Pharmacy:  ?[id number] - [place], [place] - [address]  ? [address], [place] [address]  ?Phone:  [phone]  Fax:  [phone]  ?

## 2022-04-28 NOTE — Telephone Encounter (Signed)
Patient informed. 

## 2022-04-28 NOTE — Telephone Encounter (Signed)
Last OV--07/03/2021 ?Next upcoming scheduled appointment is on 05/03/2022 ?Last RF--01/28/2022--#30 no refills ?UDS--07/03/2021 ?

## 2022-04-28 NOTE — Telephone Encounter (Signed)
His last refill was on 4/10 so he won't need a refill prior to his visit with me. Ty.  ?

## 2022-05-03 ENCOUNTER — Encounter: Payer: Self-pay | Admitting: Family Medicine

## 2022-05-03 ENCOUNTER — Ambulatory Visit (INDEPENDENT_AMBULATORY_CARE_PROVIDER_SITE_OTHER): Payer: Medicaid Other | Admitting: Family Medicine

## 2022-05-03 VITALS — BP 110/78 | HR 70 | Temp 98.2°F | Ht 71.0 in | Wt 195.4 lb

## 2022-05-03 DIAGNOSIS — G8929 Other chronic pain: Secondary | ICD-10-CM

## 2022-05-03 DIAGNOSIS — M545 Low back pain, unspecified: Secondary | ICD-10-CM | POA: Diagnosis not present

## 2022-05-03 DIAGNOSIS — F902 Attention-deficit hyperactivity disorder, combined type: Secondary | ICD-10-CM

## 2022-05-03 DIAGNOSIS — S838X1A Sprain of other specified parts of right knee, initial encounter: Secondary | ICD-10-CM | POA: Insufficient documentation

## 2022-05-03 MED ORDER — AMPHETAMINE-DEXTROAMPHETAMINE 20 MG PO TABS: 20.0000 mg | ORAL_TABLET | Freq: Every day | ORAL | 0 refills | Status: DC

## 2022-05-03 NOTE — Progress Notes (Signed)
Chief Complaint  ?Patient presents with  ? Follow-up  ?  6 month ?  ? ? ?David Gray is 21 y.o. male here for ADHD follow up. ? ?Patient is currently on Adderall 20 mg/d and compliance is excellent. ?Symptoms are well controlled.  ?Side effects include: None. ?Patient believes their dose should be not significantly changed. ?Denies common tics, weight loss, difficulties with sleep, self-medication, alcohol/drug abuse, chest pain, or palpitations. ? ?Patient reports he tore his meniscus on the right knee in high school while wrestling.  He started to run again and started to have inner knee pain over that same area.  There is no other injury or change in activity attributable.  He denies any catching or locking.  There is no swelling, redness, or bruising.  No neurologic signs or symptoms. ? ?He also notes a history of bulging disc.  No issues lifting but does have pain when he stands for long periods of time. ? ?Past Medical History:  ?Diagnosis Date  ? ADHD (attention deficit hyperactivity disorder)   ? dx  at age 60 or 5  ? ? ?BP 110/78   Pulse 70   Temp 98.2 ?F (36.8 ?C) (Oral)   Ht 5\' 11"  (1.803 m)   Wt 195 lb 6 oz (88.6 kg)   SpO2 97%   BMI 27.25 kg/m?  ?Gen- awake, alert, appearing stated age ?Heart- RRR ?Lungs- CTAB, no accessory muscle use ?Neuro- no facial tics, gait is normal ?MSK-normal active and passive range of motion, no deformity or edema/effusion, mild TTP over the anterior medial joint line, negative Stines, Lachman's, varus/valgus stress, patellar apprehension/grind ?8- age appropriate judgment and insight, normal mood and affect ? ?Attention deficit hyperactivity disorder (ADHD), combined type ? ?Meniscal injury, right, initial encounter ? ?Chronic low back pain without sciatica, unspecified back pain laterality ? ?Chronic, stable.  Continue Adderall 20 mg daily. ?Chronic, unstable.  Tylenol and ibuprofen as needed.  Stretches and exercises for the meniscus.  He does not have  any signs or symptoms concerning for a bucket-handle tear or anything necessitating surgery.  Will consider physical therapy if no improvement. ?Stretches and exercises for this as well.  Physical therapy if no improvement. ?F/u in 6 mo for CPE. ?Pt voiced understanding and agreement to the plan. ? ?Apolo Cutshaw, DO ?05/03/22 ?4:35 PM ? ? ?

## 2022-05-03 NOTE — Patient Instructions (Signed)
Ice/cold pack over area for 10-15 min twice daily. ? ?Heat (pad or rice pillow in microwave) over affected area, 10-15 minutes twice daily.  ? ?OK to take Tylenol 1000 mg (2 extra strength tabs) or 975 mg (3 regular strength tabs) every 6 hours as needed. ? ?Let us know if you need anything. ? ?Stretching and range of motion exercises ?These exercises warm up your muscles and joints and improve the movement and flexibility of your knee. These exercises also help to relieve pain and stiffness. ? ?Exercise A: Knee flexion, active ?Lie on your back with both knees straight. If this causes back discomfort, bend your uninjured knee so your foot is flat on the floor. ?Slowly slide your left / right heel back toward your buttocks until you feel a gentle stretch in the front of your knee or thigh. Stop if you have pain. ?Hold for3 seconds. ?Slowly slide your left / right heel back to the starting position. 10 total repetitions. ?Repeat 2 times. Complete this exercise 3 times a week. ? ?Exercise B: Knee extension, sitting ?Sit with your left / right heel propped on a chair, a coffee table, or a footstool. Do not have anything under your knee to support it. ?Allow your leg muscles to relax, letting gravity straighten out your knee. You should feel a stretch behind your left / right knee. ?If told by your health care provide just above your kneecap. ?Hold this position for 3 seconds. ?Repeat for a total of 10 repetitions. ?Repeat 2 times. Complete this stretch 3 times a week. ? ?Strengthening exercises ?These exercises build strength and endurance in your knee. Endurance is the ability to use your muscles for a long time, even after they get tired. ? ?Exercise C: Quadriceps, isometric ?Lie on your back with your left / right leg extended and your other knee bent. Put a rolled towel or small pillow under your right/left knee if told by your health care provider. ?Slowly tense the muscles in the front of your left / right thigh  by pushing the back of your knee down. You should see your knee cap slide up toward your hip or see increased dimpling just above the knee. ?For 3 seconds, keep the muscle as tight as you can without increasing your pain. ?Relax the muscles slowly and completely. Repeat for 10 total repetitions. ?Repeat 2 times. Complete this exercise 3 times a week. ?Exercise D: Straight leg raises (quadriceps) ?Lie on your back with your left / right leg extended and your other knee bent. ?Tense the muscles in the front of your left / right thigh. You should see your kneecap slide up or see increased dimpling just above the knee. ?Keep these muscles tight as you raise your leg 4-6 inches (10-15 cm) off the floor. ?Hold this position for 3 seconds. ?Keep these muscles tense as you lower your leg. ?Relax the muscles slowly and completely. Repeat for a total of 10 repetitions. ?Repeat 2 times. Complete this exercise 3 times a week. ? ?Exercise E: Hamstring curls ?On the floor or a bed, lie on your abdomen with your legs straight. Put a folded towel or small pillow under your left / right thigh, just above your kneecap. ?Slowly bend your left / right knee as far as you can without pain. Keep your hips flat against the floor or bed. ?Hold this position for 3 seconds. ?Slowly lower your leg to the starting position. Repeat for a total of 10 repetitions. ?Repeat 2 times. Complete  this exercise 3 times per week. ? ?Stretching exercises ?These exercises warm up your muscles and joints and improve the movement and flexibility of your knee. These exercises also help to relieve pain and stiffness. ? ?Exercise A: Quadriceps, prone ?Lie on your abdomen on a firm surface, such as a bed or padded floor. ?Bend your left / right knee and hold your ankle. If you cannot reach your ankle or pant leg, loop a belt around your foot and grab the belt instead. ?Gently pull your heel toward your buttocks. Your knee should not slide out to the side. You  should feel a stretch in the front of your thigh and knee. ?Hold this position for 30 seconds. ?Repeat 2 times. Complete this stretch 3 times a week. ? ?Exercise B: Hamstring, doorway ?Lie on your back in front of a doorway with your left / right leg resting against the wall and your other leg flat on the floor in the doorway. There should be a slight bend in your left / right knee. ?Straighten your left / right knee. You should feel a stretch behind your knee or thigh. If you do not feel that stretch, scoot your buttocks closer to the door. ?Hold this position for 30 seconds. ?Repeat 2 times. Complete this stretch 3 times a week. ? ?Strengthening exercises ?These exercises build strength and endurance in your knee and leg muscles. Endurance is the ability to use your muscles for a long time, even after they get tired. ? ? ?Exercise D: Wall slides (quadriceps) ?Lean your back against a smooth wall or door, and walk your feet out 18-24 inches (45-61 cm) from it. ?Place your feet hip-width apart. ?Slowly slide down the wall or door until your knees bend 90 degrees. Keep your knees over your heels, not over your toes. Keep your knees in line with your hips. ?Hold for 2 seconds. ?Stand up to rest for 60 seconds. ?Repeat 2 times. Complete this exercise 3 times a week. ? ?Exercise E: Bridge (hip extensors) ?Lie on your back on a firm surface with your knees bent and your feet flat on the floor. ?Tighten your buttocks muscles and lift your bottom off the floor until your trunk is level with your thighs. ?Do not arch your back. ?You should feel the muscles working in your buttocks and the back of your thighs. ?Hold this position for 2 seconds. ?Slowly lower your hips to the starting position. ?Let your buttocks muscles relax completely between repetitions. ?Repeat 2 times. Complete this exercise 3 times a week. ? ?EXERCISES  ?RANGE OF MOTION (ROM) AND STRETCHING EXERCISES - Low Back Pain ?Most people with lower back pain  will find that their symptoms get worse with excessive bending forward (flexion) or arching at the lower back (extension). The exercises that will help resolve your symptoms will focus on the opposite motion.  ?If you have pain, numbness or tingling which travels down into your buttocks, leg or foot, the goal of the therapy is for these symptoms to move closer to your back and eventually resolve. Sometimes, these leg symptoms will get better, but your lower back pain may worsen. This is often an indication of progress in your rehabilitation. Be very alert to any changes in your symptoms and the activities in which you participated in the 24 hours prior to the change. Sharing this information with your caregiver will allow him or her to most efficiently treat your condition. ?These exercises may help you when beginning to rehabilitate your  injury. Your symptoms may resolve with or without further involvement from your physician, physical therapist or athletic trainer. While completing these exercises, remember:  ?Restoring tissue flexibility helps normal motion to return to the joints. This allows healthier, less painful movement and activity. ?An effective stretch should be held for at least 30 seconds. ?A stretch should never be painful. You should only feel a gentle lengthening or release in the stretched tissue. ?FLEXION RANGE OF MOTION AND STRETCHING EXERCISES: ? ?STRETCH - Flexion, Single Knee to Chest  ?Lie on a firm bed or floor with both legs extended in front of you. ?Keeping one leg in contact with the floor, bring your opposite knee to your chest. Hold your leg in place by either grabbing behind your thigh or at your knee. ?Pull until you feel a gentle stretch in your low back. Hold 30 seconds. ?Slowly release your grasp and repeat the exercise with the opposite side. ?Repeat 2 times. Complete this exercise 3 times per week.  ? ?STRETCH - Flexion, Double Knee to Chest ?Lie on a firm bed or floor with both  legs extended in front of you. ?Keeping one leg in contact with the floor, bring your opposite knee to your chest. ?Tense your stomach muscles to support your back and then lift your other knee to your che

## 2022-05-10 ENCOUNTER — Ambulatory Visit (INDEPENDENT_AMBULATORY_CARE_PROVIDER_SITE_OTHER): Payer: Medicaid Other | Admitting: Family Medicine

## 2022-05-10 ENCOUNTER — Encounter: Payer: Self-pay | Admitting: Family Medicine

## 2022-05-10 VITALS — BP 140/60 | HR 87 | Temp 97.8°F | Ht 71.0 in | Wt 193.6 lb

## 2022-05-10 DIAGNOSIS — K140 Glossitis: Secondary | ICD-10-CM

## 2022-05-10 MED ORDER — LIDOCAINE VISCOUS HCL 2 % MT SOLN: 5.0000 mL | OROMUCOSAL | 0 refills | Status: DC | PRN

## 2022-05-10 NOTE — Patient Instructions (Addendum)
This can take 2-3 weeks to heal.  ? ?Cold and less savory foods may be better.  ? ?Ibuprofen 400-600 mg (2-3 over the counter strength tabs) every 6 hours as needed for pain. ? ?OK to take Tylenol 1000 mg (2 extra strength tabs) or 975 mg (3 regular strength tabs) every 6 hours as needed. ? ?Cory Munch is an alternative to what I sent in.  ? ?Let us know if you need anything. ?

## 2022-05-10 NOTE — Progress Notes (Signed)
Chief Complaint  ?Patient presents with  ? tongue pain   ?  Going on 1.5 days  ?Cut his tongue with teeth   ? ? ?David Gray is a 21 y.o. male here for a skin complaint. ? ?Duration: 2 days ?Location: Tip of tongue ?Pruritic? No ?Painful? Yes ?Drainage? No ?Other associated symptoms: rubbed it over a sharp part of tooth and cut it ?Therapies tried thus far: salt water and peroxide wash ? ?Past Medical History:  ?Diagnosis Date  ? ADHD (attention deficit hyperactivity disorder)   ? dx  at age 84 or 5  ? ? ?BP 140/60   Pulse 87   Temp 97.8 ?F (36.6 ?C) (Oral)   Ht 5\' 11"  (1.803 m)   Wt 193 lb 9.6 oz (87.8 kg)   SpO2 97%   BMI 27.00 kg/m?  ?Gen: awake, alert, appearing stated age ?Lungs: No accessory muscle use ?Mouth: MMM, tip of tongue there is an ulceration measuring approx 0.4 x 0.2 cm in diameter with erythematous rim. No sig soft tissue edema or drainage noted ?Psych: Age appropriate judgment and insight ? ?Ulceration, tongue traumatic - Plan: lidocaine (XYLOCAINE) 2 % solution ? ?Avoid aggravating foods. Cold items may be better. Can take 2-3 weeks to fully heal. Ibuprofen/Tylenol. Consider Orajel as alt to above sol'n.  ?F/u prn. ?The patient voiced understanding and agreement to the plan. ? ?Breyon Sigg, DO ?05/10/22 ?12:03 PM ? ?

## 2022-07-22 IMAGING — DX DG CHEST 2V
2 series · 2 of 2 positions shown · non-contrast
Comparison: None.

CLINICAL DATA: Pleuritic left chest pain

EXAM:
CHEST - 2 VIEW

[chest pa]
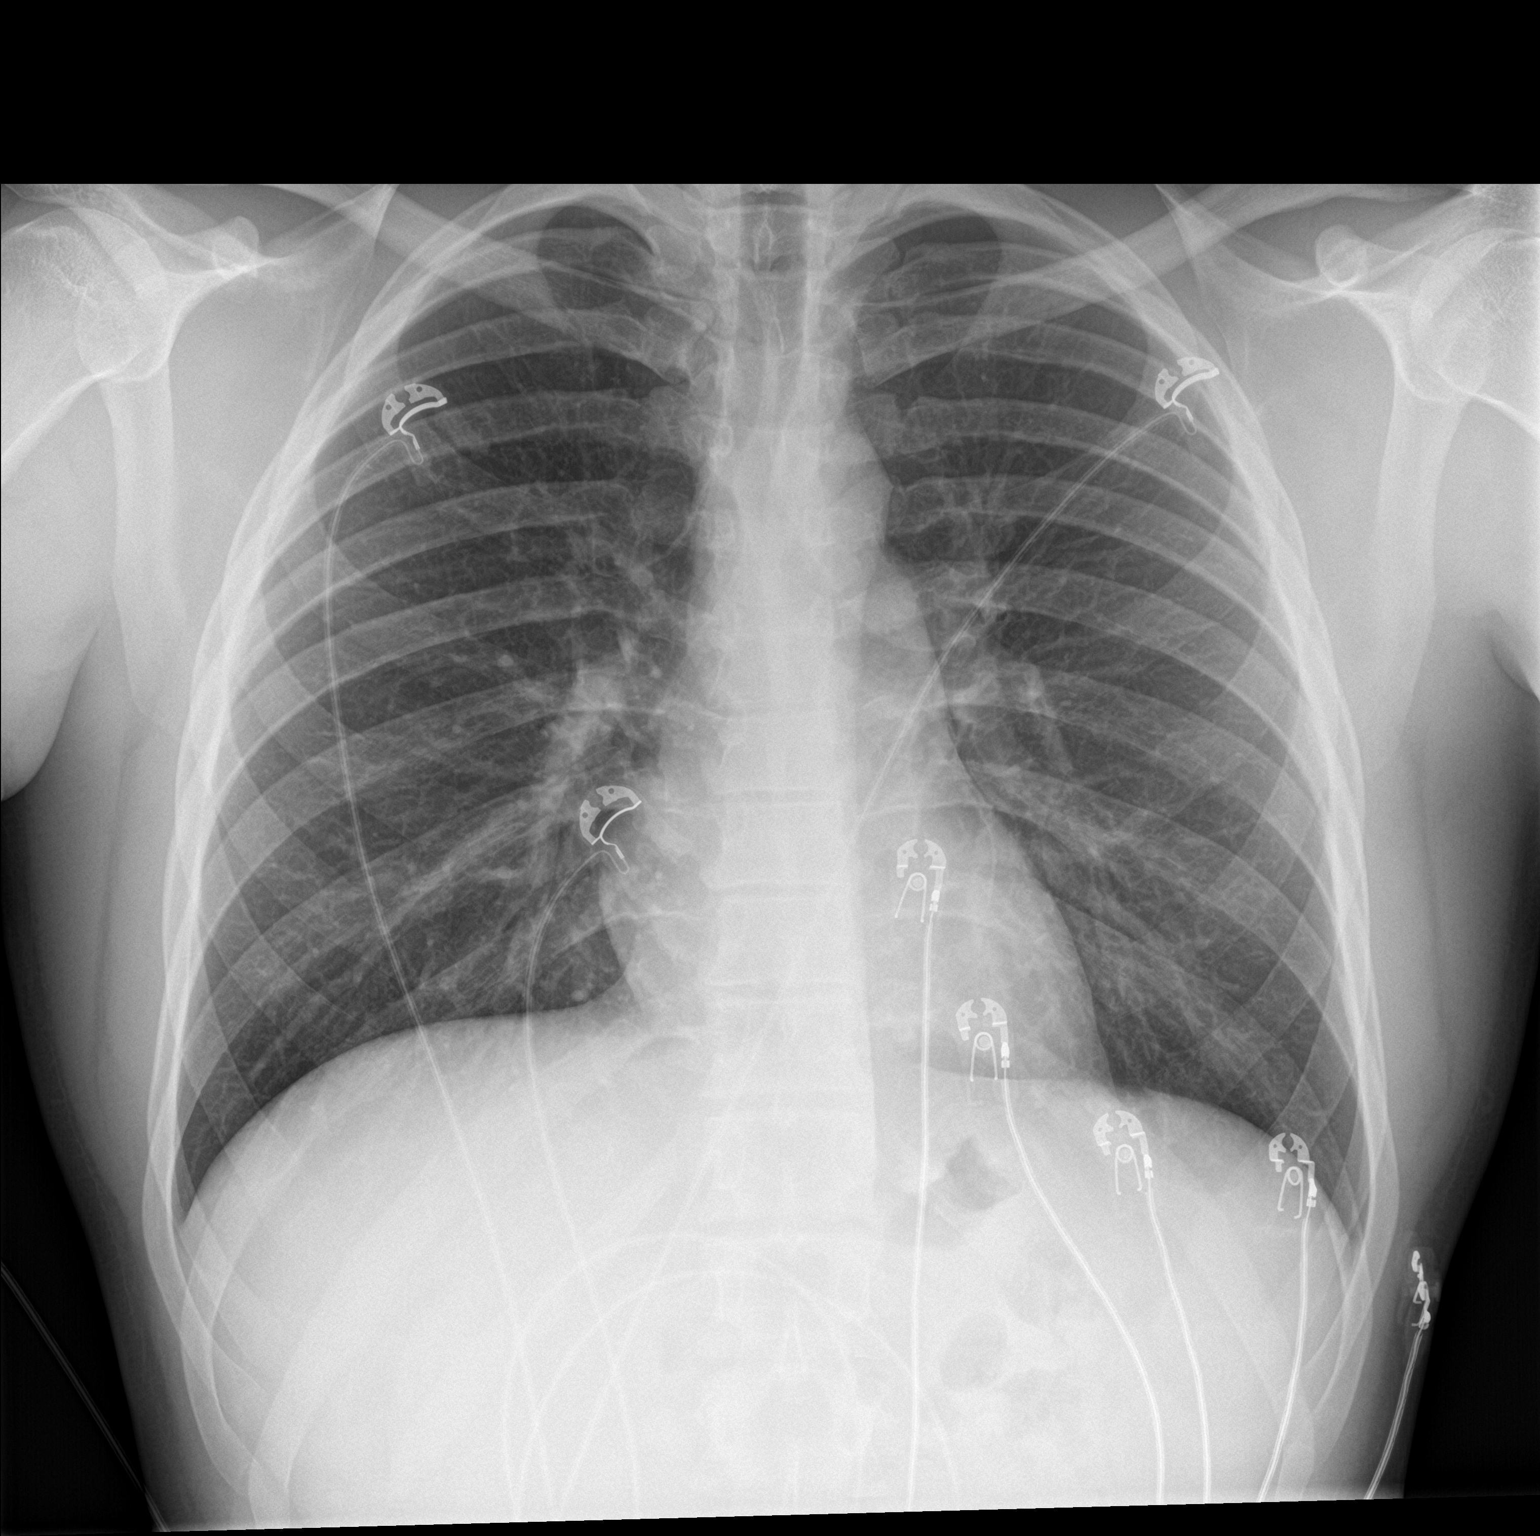

[chest lat]
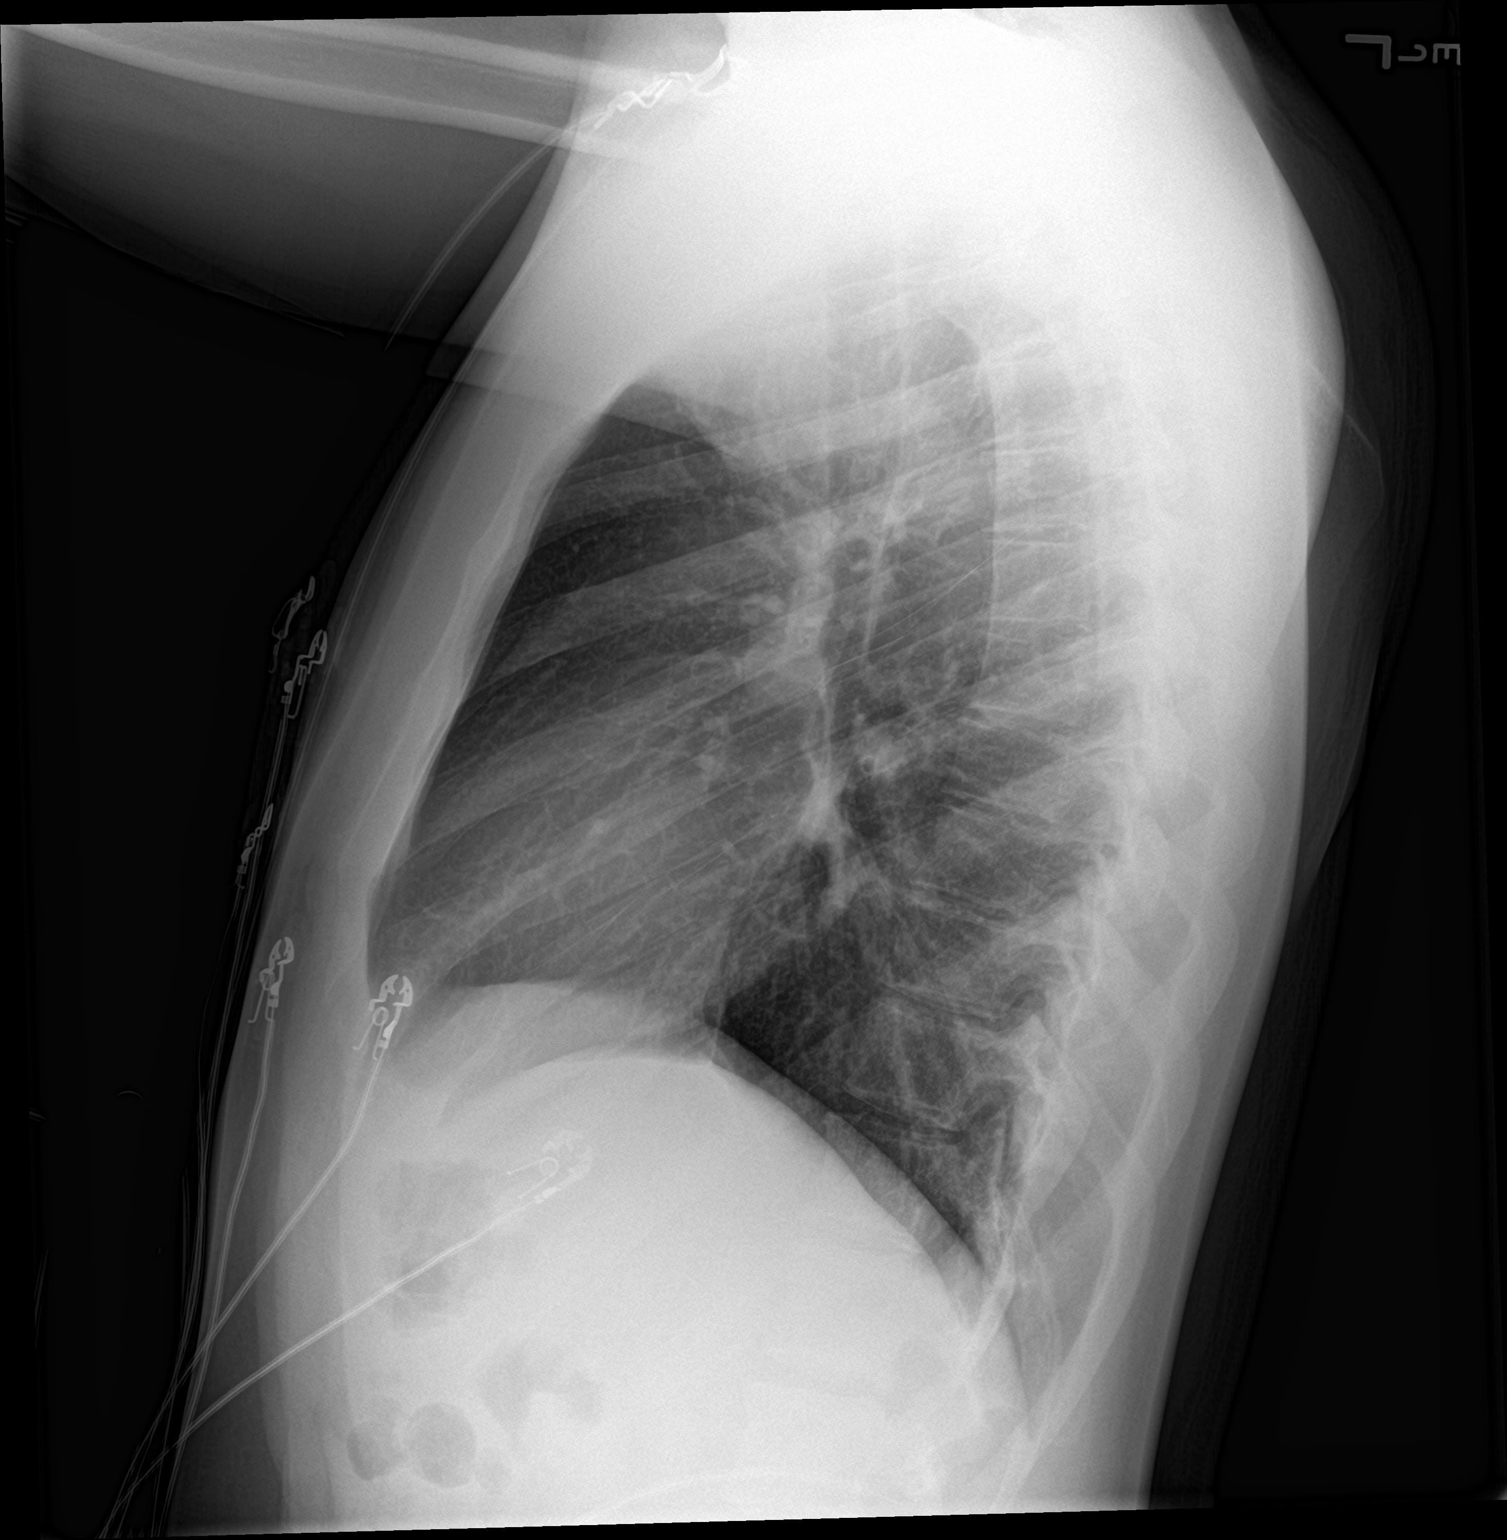

[2 of 2 positions shown; findings below may reference images not displayed]

FINDINGS: Lungs are clear.  No pleural effusion or pneumothorax.

The heart is normal in size.

Visualized osseous structures are within normal limits.
IMPRESSION: Normal chest radiographs.

## 2022-07-23 ENCOUNTER — Telehealth: Payer: Self-pay | Admitting: Family Medicine

## 2022-07-23 ENCOUNTER — Other Ambulatory Visit: Payer: Self-pay | Admitting: Family Medicine

## 2022-07-23 DIAGNOSIS — F902 Attention-deficit hyperactivity disorder, combined type: Secondary | ICD-10-CM

## 2022-07-23 MED ORDER — AMPHETAMINE-DEXTROAMPHETAMINE 20 MG PO TABS: 20.0000 mg | ORAL_TABLET | Freq: Every day | ORAL | 0 refills | Status: DC

## 2022-07-23 NOTE — Telephone Encounter (Signed)
Medication: Adderall   Preferred Pharmacy (with phone number or street name): [id number] - [place], [place]  [address], [place] [address]  Phone:  [phone]  Fax:  [phone]   Agent: Please be advised that RX refills may take up to 3 business days. We ask that you follow-up with your pharmacy.    Also, patient would like to have order placed for STD testing. Please call to advise.

## 2022-07-23 NOTE — Telephone Encounter (Signed)
Last OV--05/10/2022---Next appt scheduled on 11/03/2022 Last RF--07/02/2022 Last UDS--7/82022 Will need appt for STD.

## 2022-07-26 ENCOUNTER — Telehealth: Payer: Self-pay | Admitting: Family Medicine

## 2022-07-26 MED ORDER — AMPHETAMINE-DEXTROAMPHETAMINE 15 MG PO TABS: 15.0000 mg | ORAL_TABLET | Freq: Every day | ORAL | 0 refills | Status: DC

## 2022-07-26 NOTE — Telephone Encounter (Signed)
15's sent.

## 2022-07-26 NOTE — Telephone Encounter (Signed)
Patient informed. 

## 2022-07-26 NOTE — Telephone Encounter (Signed)
Pt stated pharmacy is out of adderall 20 MG tablet and instead has 15mg . He was wondering if that could be called in instead.   [id number] - [place], [place] - [address] AT [facility] [address]   [address], [place]  Phone:  [phone]  Fax:  [phone]

## 2022-08-20 ENCOUNTER — Ambulatory Visit: Payer: Medicaid Other | Admitting: Family Medicine

## 2022-08-20 ENCOUNTER — Telehealth: Payer: Self-pay | Admitting: Family Medicine

## 2022-08-20 NOTE — Telephone Encounter (Signed)
8.25.23 no show letter sent 

## 2022-08-23 NOTE — Telephone Encounter (Signed)
Victorino Dike -   08/20/22 pt was no show for acute visit with Dr. Veto Kemps at [facility], cannot charge no show fee due to pt having Medicaid plan, pt has 2 prior no show visits with PCP Dr. Carmelia Roller

## 2022-08-27 ENCOUNTER — Telehealth: Payer: Medicaid Other | Admitting: Physician Assistant

## 2022-08-27 DIAGNOSIS — B9689 Other specified bacterial agents as the cause of diseases classified elsewhere: Secondary | ICD-10-CM

## 2022-08-27 DIAGNOSIS — J019 Acute sinusitis, unspecified: Secondary | ICD-10-CM

## 2022-08-27 MED ORDER — AMOXICILLIN-POT CLAVULANATE 875-125 MG PO TABS
1.0000 | ORAL_TABLET | Freq: Two times a day (BID) | ORAL | 0 refills | Status: DC
Start: 2022-08-27 — End: 2022-08-31

## 2022-08-27 NOTE — Progress Notes (Signed)

## 2022-08-27 NOTE — Progress Notes (Signed)
I have spent 5 minutes in review of e-visit questionnaire, review and updating patient chart, medical decision making and response to patient.   Rajan Burgard Cody Rudell Ortman, PA-C    

## 2022-08-31 ENCOUNTER — Encounter: Payer: Self-pay | Admitting: Family Medicine

## 2022-08-31 ENCOUNTER — Ambulatory Visit (INDEPENDENT_AMBULATORY_CARE_PROVIDER_SITE_OTHER): Payer: Medicaid Other | Admitting: Family Medicine

## 2022-08-31 VITALS — BP 103/64 | HR 72 | Temp 98.1°F | Ht 70.0 in | Wt 193.0 lb

## 2022-08-31 DIAGNOSIS — J069 Acute upper respiratory infection, unspecified: Secondary | ICD-10-CM

## 2022-08-31 DIAGNOSIS — Z114 Encounter for screening for human immunodeficiency virus [HIV]: Secondary | ICD-10-CM | POA: Diagnosis not present

## 2022-08-31 MED ORDER — PROMETHAZINE-DM 6.25-15 MG/5ML PO SYRP: 5.0000 mL | ORAL_SOLUTION | Freq: Four times a day (QID) | ORAL | 0 refills | Status: DC | PRN

## 2022-08-31 MED ORDER — DOXYCYCLINE HYCLATE 100 MG PO TABS: 100.0000 mg | ORAL_TABLET | Freq: Two times a day (BID) | ORAL | 0 refills | Status: AC

## 2022-08-31 NOTE — Patient Instructions (Addendum)
Continue to push fluids, practice good hand hygiene, and cover your mouth if you cough.  If you start having fevers, shaking or shortness of breath, seek immediate care.  OK to take Tylenol 1000 mg (2 extra strength tabs) or 975 mg (3 regular strength tabs) every 6 hours as needed.  Wait 2 days before taking the medicine. Only take if you are not improving.   Let us know if you need anything.

## 2022-08-31 NOTE — Progress Notes (Signed)
Chief Complaint  Patient presents with   Cough    Chills/at night HIV testing     David Gray here for URI complaints.  Duration: 1 week  Associated symptoms: Fever (102 F max), sinus congestion, rhinorrhea, myalgia, and slight cough Denies: sinus pain, itchy watery eyes, ear pain, ear drainage, sore throat, wheezing, shortness of breath, and N/V Treatment to date: Augmentin Sick contacts: Yes; 23 year old son w similar Tested neg from covid   Past Medical History:  Diagnosis Date   ADHD (attention deficit hyperactivity disorder)    dx  at age 68 or 5    Objective BP 103/64   Pulse 72   Temp 98.1 F (36.7 C) (Oral)   Ht 5\' 10"  (1.778 m)   Wt 193 lb (87.5 kg)   SpO2 98%   BMI 27.69 kg/m  General: Awake, alert, appears stated age HEENT: AT, [place], ears patent b/l and TM's neg, nares patent w/o discharge, pharynx pink and without exudates, MMM Neck: No masses or asymmetry Heart: RRR Lungs: CTAB, no accessory muscle use Psych: Age appropriate judgment and insight, normal mood and affect  Upper respiratory tract infection, unspecified type - Plan: promethazine-dextromethorphan (PROMETHAZINE-DM) 6.25-15 MG/5ML syrup  Screening for HIV without presence of risk factors - Plan: HIV Antibody (routine testing w rflx)  Cough syrup prn. Warned about possible drowsiness.  If no improvement in the next few days, will have him take doxycycline, start Augmentin today.  Continue to push fluids, practice good hand hygiene, cover mouth when coughing. F/u prn. If starting to experience fevers, shaking, or shortness of breath, seek immediate care. Pt voiced understanding and agreement to the plan.  Zeth Buday [place], DO 08/31/22 9:24 AM

## 2022-09-02 LAB — HIV ANTIBODY (ROUTINE TESTING W REFLEX): HIV 1&2 Ab, 4th Generation: NONREACTIVE

## 2022-09-14 ENCOUNTER — Telehealth: Payer: Self-pay | Admitting: Family Medicine

## 2022-09-14 MED ORDER — AMPHETAMINE-DEXTROAMPHETAMINE 30 MG PO TABS: 30.0000 mg | ORAL_TABLET | Freq: Every day | ORAL | 0 refills | Status: DC

## 2022-09-14 NOTE — Telephone Encounter (Signed)
Pt called stating that his pharmacy is out of the 20mg  version of adderall but they do have the 30mg  version available. Pt was wondering if Dr. Nani Ravens would mind writing an Rx for that dosage since he is going out of town and doesn't want to have a crash. Rx needs to be sent to the following pharmacy:   [id number] - [place], [place] - [address]  [address] [place], [place]  [address]  Phone:  [phone]  Fax:  [phone]

## 2022-10-01 ENCOUNTER — Other Ambulatory Visit: Payer: Self-pay | Admitting: Family Medicine

## 2022-10-01 ENCOUNTER — Encounter: Payer: Self-pay | Admitting: Family Medicine

## 2022-10-01 DIAGNOSIS — F902 Attention-deficit hyperactivity disorder, combined type: Secondary | ICD-10-CM

## 2022-10-01 MED ORDER — AMPHETAMINE-DEXTROAMPHETAMINE 20 MG PO TABS: 20.0000 mg | ORAL_TABLET | Freq: Every day | ORAL | 0 refills | Status: DC

## 2022-10-01 NOTE — Telephone Encounter (Signed)
Requesting: Adderall 20mg   Contract: None UDS:07/03/21 Last Visit: 08/31/22 Next Visit:11/03/22 Last Refill: 07/23/22 #30 and 0RF   Please Advise

## 2022-10-04 ENCOUNTER — Other Ambulatory Visit: Payer: Self-pay | Admitting: Family Medicine

## 2022-10-04 ENCOUNTER — Telehealth: Payer: Self-pay | Admitting: Family Medicine

## 2022-10-04 MED ORDER — AMPHETAMINE-DEXTROAMPHETAMINE 30 MG PO TABS: 30.0000 mg | ORAL_TABLET | Freq: Every day | ORAL | 0 refills | Status: DC

## 2022-10-04 NOTE — Telephone Encounter (Signed)
Pt states pharmacy will not fill rx, unless there is a note as to why he needed an early fill. Pt states he left remaining prescription at an airbnb.    amphetamine-dextroamphetamine (ADDERALL) 30 MG tablet  [id number] - [place], [place] - [address] AT [facility] [address], [place] [address] Phone: [phone]  Fax: [phone]

## 2022-10-04 NOTE — Telephone Encounter (Signed)
Pt called back and is hoping this can be filled before 5, please advise.

## 2022-10-04 NOTE — Telephone Encounter (Signed)
Pharmacy is out of stock on the 20 mg amphetamine-dextroamphetamine but they do have 30 mg in stock. Please send in new prescription to [facility] #[id number] - [place], [place] - [address] AT [address], [place] [address] Phone: [phone]  Fax: [phone]

## 2022-10-05 NOTE — Telephone Encounter (Signed)
This refill was sent in but was early and pharmacy needs ok to fill

## 2022-10-05 NOTE — Telephone Encounter (Signed)
Initial Comment Caller states that he left his prescription bottle at an air bB, there needs to be an early refill on his medication that way the medication can be filled, there could be a verbal or a note. Translation No Nurse Assessment Nurse: Wynetta Emery, RN, Manuella Ghazi Date/Time (Eastern Time): 10/04/2022 5:42:19 PM Confirm and document reason for call. If symptomatic, describe symptoms. ---Caller states left medication, Adderall, at AirBNB, last dose was yesterday. MD sent in new prescription but did not explain it needed to be filled early. Advised unable to call in schedule 2 medications and MD office closed, call back in am to office. Does the patient have any new or worsening symptoms? ---No Disp. Time Eilene Ghazi Time) Disposition Final User 10/04/2022 5:47:10 PM Clinical Call Yes Wynetta Emery, RN, Manuella Ghazi Final Disposition 10/04/2022 5:47:10 PM Clinical Call Yes Wynetta Emery, RN, Manuella Ghazi

## 2022-10-05 NOTE — Telephone Encounter (Signed)
Patient informed of PCP response. He wanted to know he cannot get his refill until the 15th and is there anything can do because being off is pretty hard for him.

## 2022-11-03 ENCOUNTER — Encounter: Payer: Self-pay | Admitting: Family Medicine

## 2022-11-03 ENCOUNTER — Other Ambulatory Visit: Payer: Self-pay | Admitting: Family Medicine

## 2022-11-03 ENCOUNTER — Ambulatory Visit (INDEPENDENT_AMBULATORY_CARE_PROVIDER_SITE_OTHER): Payer: Medicaid Other | Admitting: Family Medicine

## 2022-11-03 VITALS — BP 110/68 | HR 87 | Temp 98.5°F | Ht 70.0 in | Wt 201.5 lb

## 2022-11-03 DIAGNOSIS — Z79899 Other long term (current) drug therapy: Secondary | ICD-10-CM

## 2022-11-03 DIAGNOSIS — Z0000 Encounter for general adult medical examination without abnormal findings: Secondary | ICD-10-CM

## 2022-11-03 NOTE — Progress Notes (Signed)
Chief Complaint  Patient presents with   Annual Exam    Well Male David Gray is here for a complete physical.   His last physical was >1 year ago.  Current diet: in general, a "healthy" diet.   Current exercise: running, lifting wts Weight trend: up a few lbs Fatigue out of ordinary? No. Seat belt? Yes.   Advanced directive? No  Health maintenance Tetanus- Yes HIV- Yes Hep C- Yes  Past Medical History:  Diagnosis Date   ADHD (attention deficit hyperactivity disorder)    dx  at age 31 or 28     Past Surgical History:  Procedure Laterality Date   NO PAST SURGERIES      Medications  Current Outpatient Medications on File Prior to Visit  Medication Sig Dispense Refill   amphetamine-dextroamphetamine (ADDERALL) 20 MG tablet Take 1 tablet (20 mg total) by mouth daily. 30 tablet 0   [START ON 11/30/2022] amphetamine-dextroamphetamine (ADDERALL) 20 MG tablet Take 1 tablet (20 mg total) by mouth daily. 30 tablet 0   amphetamine-dextroamphetamine (ADDERALL) 30 MG tablet Take 1 tablet by mouth daily. 30 tablet 0   Allergies No Known Allergies  Family History Family History  Problem Relation Age of Onset   Bipolar disorder Mother    Drug abuse Mother    Cancer Other        GGM breast ca   Bipolar disorder Father    Drug abuse Father    Bipolar disorder Maternal Grandmother    COPD Maternal Aunt    Diabetes Neg Hx    Coronary artery disease Neg Hx    Colon cancer Neg Hx    Prostate cancer Neg Hx     Review of Systems: Constitutional: no fevers or chills Eye:  no recent significant change in vision Ear/Nose/Mouth/Throat:  Ears:  no hearing loss Nose/Mouth/Throat:  no complaints of nasal congestion, no sore throat Cardiovascular:  no chest pain Respiratory:  no shortness of breath Gastrointestinal:  no abdominal pain, no change in bowel habits GU:  Male: negative for dysuria Musculoskeletal/Extremities:  no new pain of the joints Integumentary  (Skin/Breast):  no abnormal skin lesions reported Neurologic:  no headaches Endocrine: No unexpected weight changes Hematologic/Lymphatic:  no night sweats  Exam BP 110/68 (BP Location: Left Arm, Patient Position: Sitting, Cuff Size: Normal)   Pulse 87   Temp 98.5 F (36.9 C) (Oral)   Ht 5\' 10"  (1.778 m)   Wt 201 lb 8 oz (91.4 kg)   SpO2 96%   BMI 28.91 kg/m  General:  well developed, well nourished, in no apparent distress Skin:  no significant moles, warts, or growths Head:  no masses, lesions, or tenderness Eyes:  pupils equal and round, sclera anicteric without injection Ears:  canals without lesions, TMs shiny without retraction, no obvious effusion, no erythema Nose:  nares patent, mucosa normal Throat/Pharynx:  lips and gingiva without lesion; tongue and uvula midline; non-inflamed pharynx; no exudates or postnasal drainage Neck: neck supple without adenopathy, thyromegaly, or masses Lungs:  clear to auscultation, breath sounds equal bilaterally, no respiratory distress Cardio:  regular rate and rhythm, no bruits, no LE edema Abdomen:  abdomen soft, nontender; bowel sounds normal; no masses or organomegaly Genital (male): Deferred Rectal: Deferred Musculoskeletal:  symmetrical muscle groups noted without atrophy or deformity Extremities:  no clubbing, cyanosis, or edema, no deformities, no skin discoloration Neuro:  gait normal; deep tendon reflexes normal and symmetric Psych: well oriented with normal range of affect and appropriate judgment/insight  Assessment and Plan  Well adult exam - Plan: CBC, Comprehensive metabolic panel, Lipid panel   Well 21 y.o. male. Counseled on diet and exercise. Self testicular exams recommended at least monthly.  Advanced directive form provided today.  Flu shot declined politely. Other orders as above. UDS and CSC updated today.  Follow up in 6 mo pending the above workup. The patient voiced understanding and agreement to the  plan.  Sharrod Achille [place], DO 11/03/22 1:11 PM

## 2022-11-03 NOTE — Addendum Note (Signed)
Addended by: Scharlene Gloss B on: 11/03/2022 01:16 PM   Modules accepted: Orders

## 2022-11-03 NOTE — Patient Instructions (Addendum)
Give [place] 2-3 business days to get the results of your labs back.   Keep the diet clean and stay active.  Please get me a copy of your advanced directive form at your convenience.   Do monthly self testicular checks in the shower. You are feeling for lumps/bumps that don't belong. If you feel anything like this, let me know!  Consider a nasal spray before bed like Flonase.   Let us know if you need anything.

## 2022-11-04 LAB — COMPREHENSIVE METABOLIC PANEL
ALT: 41 U/L (ref 0–53)
AST: 26 U/L (ref 0–37)
Albumin: 4.4 g/dL (ref 3.5–5.2)
Alkaline Phosphatase: 57 U/L (ref 39–117)
BUN: 11 mg/dL (ref 6–23)
CO2: 32 mEq/L (ref 19–32)
Calcium: 9.4 mg/dL (ref 8.4–10.5)
Chloride: 103 mEq/L (ref 96–112)
Creatinine, Ser: 0.87 mg/dL (ref 0.40–1.50)
GFR: 121.9 mL/min (ref >60.00)
Glucose, Bld: 90 mg/dL (ref 70–99)
Potassium: 4 mEq/L (ref 3.5–5.1)
Sodium: 141 mEq/L (ref 135–145)
Total Bilirubin: 0.5 mg/dL (ref 0.2–1.2)
Total Protein: 6.9 g/dL (ref 6.0–8.3)

## 2022-11-04 LAB — LIPID PANEL
Cholesterol: 126 mg/dL (ref 0–200)
HDL: 26.8 mg/dL — ABNORMAL LOW (ref >39.00)
LDL Cholesterol: 78 mg/dL (ref 0–99)
NonHDL: 99.35
Total CHOL/HDL Ratio: 5
Triglycerides: 109 mg/dL (ref 0.0–149.0)
VLDL: 21.8 mg/dL (ref 0.0–40.0)

## 2022-11-04 LAB — CBC
HCT: 41.2 % (ref 39.0–52.0)
Hemoglobin: 13.5 g/dL (ref 13.0–17.0)
MCHC: 32.8 g/dL (ref 30.0–36.0)
MCV: 85.5 fl (ref 78.0–100.0)
Platelets: 282 10*3/uL (ref 150.0–400.0)
RBC: 4.82 Mil/uL (ref 4.22–5.81)
RDW: 16 % — ABNORMAL HIGH (ref 11.5–15.5)
WBC: 7.5 10*3/uL (ref 4.0–10.5)

## 2022-11-05 LAB — DRUG MONITORING PANEL 376104, URINE
Amphetamines: NEGATIVE ng/mL (ref <500)
Barbiturates: NEGATIVE ng/mL (ref <300)
Benzodiazepines: NEGATIVE ng/mL (ref <100)
Cocaine Metabolite: NEGATIVE ng/mL (ref <150)
Desmethyltramadol: NEGATIVE ng/mL (ref <100)
Opiates: NEGATIVE ng/mL (ref <100)
Oxycodone: NEGATIVE ng/mL (ref <100)
Tramadol: NEGATIVE ng/mL (ref <100)

## 2022-11-05 LAB — DM TEMPLATE

## 2022-12-14 ENCOUNTER — Ambulatory Visit (INDEPENDENT_AMBULATORY_CARE_PROVIDER_SITE_OTHER): Payer: Medicaid Other | Admitting: Family Medicine

## 2022-12-14 ENCOUNTER — Encounter: Payer: Self-pay | Admitting: Family Medicine

## 2022-12-14 VITALS — BP 121/82 | HR 79 | Temp 98.1°F | Ht 70.0 in | Wt 209.5 lb

## 2022-12-14 DIAGNOSIS — N6341 Unspecified lump in right breast, subareolar: Secondary | ICD-10-CM

## 2022-12-14 NOTE — Patient Instructions (Signed)
We will be in touch regarding your results.   Ice/cold pack over area for 10-15 min twice daily.  OK to take Tylenol 1000 mg (2 extra strength tabs) or 975 mg (3 regular strength tabs) every 6 hours as needed.  Let us know if you need anything.

## 2022-12-14 NOTE — Progress Notes (Signed)
Chief Complaint  Patient presents with   Follow-up    testosterone    CHOZEN LATULIPPE is a 21 y.o. male here for a skin complaint.  Duration: 3 days Location: R nipple Pruritic? No Painful? No Drainage? No New soaps/lotions/topicals/detergents? No Trauma? No Other associated symptoms: no fevers, redness, bruising Therapies tried thus far: none  Past Medical History:  Diagnosis Date   ADHD (attention deficit hyperactivity disorder)    dx  at age 68 or 5    BP 121/82 (BP Location: Left Arm, Patient Position: Sitting, Cuff Size: Normal)   Pulse 79   Temp 98.1 F (36.7 C) (Oral)   Ht 5\' 10"  (1.778 m)   Wt 209 lb 8 oz (95 kg)   SpO2 98%   BMI 30.06 kg/m  Gen: awake, alert, appearing stated age Lungs: No accessory muscle use Skin: 0.3 centimeter circular lesion deep to the areola of the right upper quadrant, it is TTP. No drainage, erythema, fluctuance, excoriation Psych: Age appropriate judgment and insight  Subareolar mass of right breast - Plan: BREAST COMPLETE UNI RIGHT INC AXILLA  Check an ultrasound just for the safe side.  It is probably a cyst.  Ice, Tylenol as needed.  Follow-up pending results. F/u prn. The patient voiced understanding and agreement to the plan.  Makhari Dovidio [place], DO 12/14/22 11:42 AM

## 2022-12-21 ENCOUNTER — Ambulatory Visit: Payer: Medicaid Other | Admitting: Family Medicine

## 2023-02-09 ENCOUNTER — Telehealth: Payer: Self-pay | Admitting: Family Medicine

## 2023-02-09 DIAGNOSIS — F902 Attention-deficit hyperactivity disorder, combined type: Secondary | ICD-10-CM

## 2023-02-09 MED ORDER — AMPHETAMINE-DEXTROAMPHETAMINE 20 MG PO TABS: 20.0000 mg | ORAL_TABLET | Freq: Every day | ORAL | 0 refills | Status: DC

## 2023-02-09 MED ORDER — AMPHETAMINE-DEXTROAMPHETAMINE 30 MG PO TABS: 30.0000 mg | ORAL_TABLET | Freq: Every day | ORAL | 0 refills | Status: DC

## 2023-02-09 NOTE — Telephone Encounter (Signed)
Last RF---11/30/2022 Last OV---12/14/2022

## 2023-02-09 NOTE — Telephone Encounter (Signed)
Prescription Request  02/09/2023  Is this a "Controlled Substance" medicine? Yes  LOV: 12/14/2022  What is the name of the medication or equipment?   amphetamine-dextroamphetamine (ADDERALL) 20 MG tablet [id number]   Have you contacted your pharmacy to request a refill? Yes   Which pharmacy would you like this sent to?  [id number] - [place], [place] - [address] [place] [address] Phone: [phone] Fax: [phone]    Patient notified that their request is being sent to the clinical staff for review and that they should receive a response within 2 business days.   Please advise at Mobile [phone] (mobile)

## 2023-02-13 ENCOUNTER — Telehealth: Payer: Medicaid Other | Admitting: Family

## 2023-02-13 DIAGNOSIS — R6889 Other general symptoms and signs: Secondary | ICD-10-CM

## 2023-02-13 DIAGNOSIS — Z20828 Contact with and (suspected) exposure to other viral communicable diseases: Secondary | ICD-10-CM | POA: Diagnosis not present

## 2023-02-13 MED ORDER — OSELTAMIVIR PHOSPHATE 75 MG PO CAPS: 75.0000 mg | ORAL_CAPSULE | Freq: Two times a day (BID) | ORAL | 0 refills | Status: DC

## 2023-02-13 NOTE — Progress Notes (Signed)

## 2023-02-13 NOTE — Progress Notes (Signed)
Hello, I am confused at what you are needing. You completed the vaginal discharge questions, but did not add anything else to your visit.   Evelina Dun, FNP

## 2023-02-21 ENCOUNTER — Telehealth: Payer: Medicaid Other | Admitting: Family Medicine

## 2023-02-21 DIAGNOSIS — B9689 Other specified bacterial agents as the cause of diseases classified elsewhere: Secondary | ICD-10-CM

## 2023-02-21 DIAGNOSIS — J019 Acute sinusitis, unspecified: Secondary | ICD-10-CM

## 2023-02-21 MED ORDER — BENZONATATE 100 MG PO CAPS: 100.0000 mg | ORAL_CAPSULE | Freq: Three times a day (TID) | ORAL | 0 refills | Status: DC | PRN

## 2023-02-21 MED ORDER — AMOXICILLIN-POT CLAVULANATE 875-125 MG PO TABS: 1.0000 | ORAL_TABLET | Freq: Two times a day (BID) | ORAL | 0 refills | Status: AC

## 2023-02-21 NOTE — Progress Notes (Signed)
E-Visit for Sinus Problems  We are sorry that you are not feeling well.  Here is how we plan to help!  Based on what you have shared with me it looks like you have sinusitis.  Sinusitis is inflammation and infection in the sinus cavities of the head.  Based on your presentation I believe you most likely have Acute Bacterial Sinusitis.  This is an infection caused by bacteria and is treated with antibiotics. I have prescribed Augmentin '875mg'$ /'125mg'$  one tablet twice daily with food, for 7 days. You may use an oral decongestant such as Mucinex D or if you have glaucoma or high blood pressure use plain Mucinex. Saline nasal spray help and can safely be used as often as needed for congestion.  If you develop worsening sinus pain, fever or notice severe headache and vision changes, or if symptoms are not better after completion of antibiotic, please schedule an appointment with a health care provider.    I have also order some tessalon perles for your cough.  Sinus infections are not as easily transmitted as other respiratory infection, however we still recommend that you avoid close contact with loved ones, especially the very young and elderly.  Remember to wash your hands thoroughly throughout the day as this is the number one way to prevent the spread of infection!  Home Care: Only take medications as instructed by your medical team. Complete the entire course of an antibiotic. Do not take these medications with alcohol. A steam or ultrasonic humidifier can help congestion.  You can place a towel over your head and breathe in the steam from hot water coming from a faucet. Avoid close contacts especially the very young and the elderly. Cover your mouth when you cough or sneeze. Always remember to wash your hands.  Get Help Right Away If: You develop worsening fever or sinus pain. You develop a severe head ache or visual changes. Your symptoms persist after you have completed your treatment  plan.  Make sure you Understand these instructions. Will watch your condition. Will get help right away if you are not doing well or get worse.  Thank you for choosing an e-visit.  Your e-visit answers were reviewed by a board certified advanced clinical practitioner to complete your personal care plan. Depending upon the condition, your plan could have included both over the counter or prescription medications.  Please review your pharmacy choice. Make sure the pharmacy is open so you can pick up prescription now. If there is a problem, you may contact your provider through [employer] and have the prescription routed to another pharmacy.  Your safety is important to [place]. If you have drug allergies check your prescription carefully.   For the next 24 hours you can use MyChart to ask questions about today's visit, request a non-urgent call back, or ask for a work or school excuse. You will get an email in the next two days asking about your experience. I hope that your e-visit has been valuable and will speed your recovery.  I provided 5 minutes of non face-to-face time during this encounter for chart review, medication and order placement, as well as and documentation.

## 2023-04-13 ENCOUNTER — Ambulatory Visit: Payer: Medicaid Other | Admitting: Family Medicine

## 2023-05-04 ENCOUNTER — Encounter: Payer: Self-pay | Admitting: Family Medicine

## 2023-05-04 ENCOUNTER — Ambulatory Visit (INDEPENDENT_AMBULATORY_CARE_PROVIDER_SITE_OTHER): Payer: Medicaid Other | Admitting: Family Medicine

## 2023-05-04 VITALS — BP 118/64 | HR 98 | Temp 98.1°F | Ht 69.0 in | Wt 203.0 lb

## 2023-05-04 DIAGNOSIS — F902 Attention-deficit hyperactivity disorder, combined type: Secondary | ICD-10-CM | POA: Diagnosis not present

## 2023-05-04 DIAGNOSIS — Z23 Encounter for immunization: Secondary | ICD-10-CM

## 2023-05-04 MED ORDER — AMPHETAMINE-DEXTROAMPHETAMINE 20 MG PO TABS: 20.0000 mg | ORAL_TABLET | Freq: Every day | ORAL | 0 refills | Status: DC

## 2023-05-04 MED ORDER — AMPHETAMINE-DEXTROAMPHETAMINE 30 MG PO TABS: 30.0000 mg | ORAL_TABLET | Freq: Every day | ORAL | 0 refills | Status: DC

## 2023-05-04 NOTE — Progress Notes (Signed)
Chief Complaint  Patient presents with   Follow-up    6 month     David Gray is 22 y.o. male here for ADHD follow up.  Patient is currently on Adderall 20 mg/d and compliance is excellent. Symptoms well controlled.  Side effects include: None. Patient believes their dose should be not significantly changed. Denies tics, weight loss, difficulties with sleep, self-medication, alcohol/drug abuse, chest pain, or palpitations.   Past Medical History:  Diagnosis Date   ADHD (attention deficit hyperactivity disorder)    dx  at age 73 or 5    BP 118/64 (BP Location: Left Arm, Patient Position: Sitting, Cuff Size: Large)   Pulse 98   Temp 98.1 F (36.7 C) (Oral)   Ht 5\' 9"  (1.753 m)   Wt 203 lb (92.1 kg)   SpO2 96%   BMI 29.98 kg/m  Gen- awake, alert, appearing stated age Heart- RRR Lungs- CTAB, no accessory muscle use Neuro- no facial tics, DTR's equal and symmetric, no clonus Psych- age appropriate judgment and insight, normal mood and affect  Attention deficit hyperactivity disorder (ADHD), combined type - Plan: amphetamine-dextroamphetamine (ADDERALL) 20 MG tablet, amphetamine-dextroamphetamine (ADDERALL) 20 MG tablet, amphetamine-dextroamphetamine (ADDERALL) 30 MG tablet  Chronic, stable.  Continue Adderall 20 mg daily. Tdap today. F/u in 6 mo for CPE. Pt voiced understanding and agreement to the plan.  Acencion Toni Greencastle, DO 05/04/23 11:21 AM

## 2023-05-04 NOTE — Addendum Note (Signed)
Addended by: Scharlene Gloss B on: 05/04/2023 11:34 AM   Modules accepted: Orders

## 2023-05-04 NOTE — Patient Instructions (Signed)
Let us know when you need refills.  

## 2023-07-06 ENCOUNTER — Ambulatory Visit: Payer: Medicaid Other | Admitting: Family Medicine

## 2023-07-06 ENCOUNTER — Ambulatory Visit
Admission: EM | Admit: 2023-07-06 | Discharge: 2023-07-06 | Disposition: A | Discharge status: Home / Self Care | Payer: Medicaid Other | Attending: Internal Medicine | Admitting: Internal Medicine

## 2023-07-06 DIAGNOSIS — L0231 Cutaneous abscess of buttock: Secondary | ICD-10-CM

## 2023-07-06 MED ORDER — DOXYCYCLINE HYCLATE 100 MG PO CAPS: 100.0000 mg | ORAL_CAPSULE | Freq: Two times a day (BID) | ORAL | 0 refills | Status: AC

## 2023-07-06 MED ORDER — DOXYCYCLINE HYCLATE 100 MG PO CAPS: 100.0000 mg | ORAL_CAPSULE | Freq: Two times a day (BID) | ORAL | 0 refills | Status: DC

## 2023-07-06 NOTE — ED Provider Notes (Signed)
[facility]    CSN: [id number] Arrival date & time: 07/06/23  4098      History   Chief Complaint Chief Complaint  Patient presents with   Cyst    HPI David Gray is a 22 y.o. male.   Patient presents to urgent care for evaluation of abscess to the left buttock that started 2 days ago. States he has had abscesses in the past to his axilla and buttock but they have drained on their own. States he "sweats a lot" and believes this is why he experiences abscesses. No recent antibiotic/steroid use, fever, chills, body aches, or paresthesias. No recent trauma/injuries to the area. He has been using over the counter medicines as needed for pain without much relief.      Past Medical History:  Diagnosis Date   ADHD (attention deficit hyperactivity disorder)    dx  at age 62 or 83    Patient Active Problem List   Diagnosis Date Noted   Meniscal injury, right, initial encounter 05/03/2022   Chronic low back pain without sciatica 06/03/2021   Chronic motor tic 06/03/2021   PCP NOTES >>>> 10/15/2015   Annual physical exam 06/15/2013   ADHD (attention deficit hyperactivity disorder)     Past Surgical History:  Procedure Laterality Date   NO PAST SURGERIES         Home Medications    Prior to Admission medications   Medication Sig Start Date End Date Taking? Authorizing Provider  amphetamine-dextroamphetamine (ADDERALL) 20 MG tablet Take 1 tablet (20 mg total) by mouth daily. 07/03/23   Sharlene Dory, DO  amphetamine-dextroamphetamine (ADDERALL) 20 MG tablet Take 1 tablet (20 mg total) by mouth daily. 06/03/23   Sharlene Dory, DO  amphetamine-dextroamphetamine (ADDERALL) 30 MG tablet Take 1 tablet by mouth daily. 05/04/23   Sharlene Dory, DO  doxycycline (VIBRAMYCIN) 100 MG capsule Take 1 capsule (100 mg total) by mouth 2 (two) times daily for 10 days. 07/06/23 07/16/23  Carlisle Beers, FNP    Family History Family History   Problem Relation Age of Onset   Bipolar disorder Mother    Drug abuse Mother    Cancer Other        GGM breast ca   Bipolar disorder Father    Drug abuse Father    Bipolar disorder Maternal Grandmother    COPD Maternal Aunt    Diabetes Neg Hx    Coronary artery disease Neg Hx    Colon cancer Neg Hx    Prostate cancer Neg Hx     Social History Social History   Tobacco Use   Smoking status: Never   Smokeless tobacco: Never   Tobacco comments:    mother smokes outside  Vaping Use   Vaping Use: Never used  Substance Use Topics   Alcohol use: Not Currently   Drug use: Yes    Types: Marijuana     Allergies   Patient has no known allergies.   Review of Systems Review of Systems Per HPI  Physical Exam Triage Vital Signs ED Triage Vitals  Enc Vitals Group     BP 07/06/23 0916 134/76     Pulse Rate 07/06/23 0916 94     Resp 07/06/23 0916 16     Temp 07/06/23 0916 98.8 F (37.1 C)     Temp Source 07/06/23 0916 Oral     SpO2 07/06/23 0916 97 %     Weight --  Height --      Head Circumference --      Peak Flow --      Pain Score 07/06/23 0918 6     Pain Loc --      Pain Edu? --      Excl. in GC? --    No data found.  Updated Vital Signs BP 134/76 (BP Location: Right Arm)   Pulse 94   Temp 98.8 F (37.1 C) (Oral)   Resp 16   SpO2 97%   Visual Acuity Right Eye Distance:   Left Eye Distance:   Bilateral Distance:    Right Eye Near:   Left Eye Near:    Bilateral Near:     Physical Exam Vitals and nursing note reviewed.  Constitutional:      Appearance: He is not ill-appearing or toxic-appearing.  HENT:     Head: Normocephalic and atraumatic.     Right Ear: Hearing and external ear normal.     Left Ear: Hearing and external ear normal.     Nose: Nose normal.     Mouth/Throat:     Lips: Pink.  Eyes:     General: Lids are normal. Vision grossly intact. Gaze aligned appropriately.     Extraocular Movements: Extraocular movements intact.      Conjunctiva/sclera: Conjunctivae normal.  Pulmonary:     Effort: Pulmonary effort is normal.  Musculoskeletal:     Cervical back: Neck supple.  Skin:    General: Skin is warm and dry.     Capillary Refill: Capillary refill takes less than 2 seconds.     Findings: No rash.          Comments: 3 cm x 2 cm left gluteal cleft abscess that is slightly fluctuant to palpation centrally with surrounding area of induration, tenderness, and erythema.  Abscess is warm and tender to palpation.   Neurological:     General: No focal deficit present.     Mental Status: He is alert and oriented to person, place, and time. Mental status is at baseline.     Cranial Nerves: No dysarthria or facial asymmetry.  Psychiatric:        Mood and Affect: Mood normal.        Speech: Speech normal.        Behavior: Behavior normal.        Thought Content: Thought content normal.        Judgment: Judgment normal.      UC Treatments / Results  Labs (all labs ordered are listed, but only abnormal results are displayed) Labs Reviewed - No data to display  EKG   Radiology No results found.  Procedures Incision and Drainage  Date/Time: 07/06/2023 10:23 AM  Performed by: Carlisle Beers, FNP Authorized by: Carlisle Beers, FNP   Consent:    Consent obtained:  Verbal   Consent given by:  Patient   Risks, benefits, and alternatives were discussed: yes     Risks discussed:  Bleeding, damage to other organs, infection, incomplete drainage and pain   Alternatives discussed:  No treatment Universal protocol:    Procedure explained and questions answered to patient or proxy's satisfaction: yes     Patient identity confirmed:  Verbally with patient Location:    Type:  Abscess   Size:  3cm by 2cm   Location:  Anogenital   Anogenital location:  Gluteal cleft Pre-procedure details:    Skin preparation:  Povidone-iodine and chlorhexidine Sedation:  Sedation type:  None Anesthesia:     Anesthesia method:  Local infiltration   Local anesthetic:  Lidocaine 1% WITH epi Procedure type:    Complexity:  Complex Procedure details:    Incision types:  Stab incision and single straight   Incision depth:  Dermal   Wound management:  Probed and deloculated and irrigated with saline   Drainage:  Purulent and bloody   Drainage amount:  Scant   Wound treatment:  Wound left open   Packing materials:  None Post-procedure details:    Procedure completion:  Tolerated well, no immediate complications  (including critical care time)  Medications Ordered in UC Medications - No data to display  Initial Impression / Assessment and Plan / UC Course  I have reviewed the triage vital signs and the nursing notes.  Pertinent labs & imaging results that were available during my care of the patient were reviewed by me and considered in my medical decision making (see chart for details).  Abscess of buttock, left See incision and drainage note above for further detail regarding incision and drainage procedure, patient tolerated well.  Wound cleansed and dressed in clinic. Wound care discussed. Warm compresses recommended to allow wound to drain further. Doxycycline antibiotic as prescribed.  Tylenol may be used as needed for pain once numbing wears off.   Discussed red flag signs and symptoms of worsening condition,when to call the PCP office, return to urgent care, and when to seek higher level of care in the emergency department. Counseled patient regarding appropriate use of medications and potential side effects for all medications recommended or prescribed today. Patient verbalizes understanding and agreement with plan. Discharged in stable condition.     Final Clinical Impressions(s) / UC Diagnoses   Final diagnoses:  Abscess of buttock, left     Discharge Instructions      We drained your abscess today in the clinic and left open to continue to drain.  Continue to use warm  compresses to the wound to further encourage drainage from the wound.  You may do this in the shower as well with gentle compresses to the area to allow more infected material to drain.   Take antibiotic as prescribed to treat infection to the abscess.   Change your dressings twice daily as the wound heals.  Do not apply any ointments, lotions, or powders to the wound.  You may take over the counter pain medicines as needed for pain once the numbing wears off.  If you notice any worsening signs of infection such as redness, swelling, fever, or worsening drainage, please return to urgent care for reevaluation.      ED Prescriptions     Medication Sig Dispense Auth. Provider   doxycycline (VIBRAMYCIN) 100 MG capsule  (Status: Discontinued) Take 1 capsule (100 mg total) by mouth 2 (two) times daily for 7 days. 14 capsule Carlisle Beers, FNP   doxycycline (VIBRAMYCIN) 100 MG capsule Take 1 capsule (100 mg total) by mouth 2 (two) times daily for 10 days. 20 capsule Carlisle Beers, FNP      PDMP not reviewed this encounter.   Carlisle Beers, [place] 07/06/23 1024

## 2023-07-06 NOTE — Discharge Instructions (Signed)
We drained your abscess today in the clinic and left open to continue to drain.  Continue to use warm compresses to the wound to further encourage drainage from the wound.  You may do this in the shower as well with gentle compresses to the area to allow more infected material to drain.   Take antibiotic as prescribed to treat infection to the abscess.   Change your dressings twice daily as the wound heals.  Do not apply any ointments, lotions, or powders to the wound.  You may take over the counter pain medicines as needed for pain once the numbing wears off.  If you notice any worsening signs of infection such as redness, swelling, fever, or worsening drainage, please return to urgent care for reevaluation.

## 2023-07-06 NOTE — ED Triage Notes (Signed)
Pt reports he has a cyst in the left buttock x 2 days. Pt reporst this is the 5th cyst in 1 month, he had is differnet parts of his bory.

## 2023-08-12 ENCOUNTER — Other Ambulatory Visit: Payer: Self-pay | Admitting: Family Medicine

## 2023-08-12 DIAGNOSIS — F902 Attention-deficit hyperactivity disorder, combined type: Secondary | ICD-10-CM

## 2023-08-12 MED ORDER — AMPHETAMINE-DEXTROAMPHETAMINE 20 MG PO TABS
20.0000 mg | ORAL_TABLET | Freq: Every day | ORAL | 0 refills | Status: DC
Start: 2023-09-11 — End: 2023-11-07

## 2023-08-12 MED ORDER — AMPHETAMINE-DEXTROAMPHETAMINE 30 MG PO TABS: 30.0000 mg | ORAL_TABLET | Freq: Every day | ORAL | 0 refills | Status: DC

## 2023-08-12 MED ORDER — AMPHETAMINE-DEXTROAMPHETAMINE 20 MG PO TABS: 20.0000 mg | ORAL_TABLET | Freq: Every day | ORAL | 0 refills | Status: DC

## 2023-08-12 NOTE — Addendum Note (Signed)
Addended by: Maximino Sarin on: 08/12/2023 03:35 PM   Modules accepted: Orders

## 2023-08-12 NOTE — Addendum Note (Signed)
Addended by: Radene Gunning on: 08/12/2023 04:41 PM   Modules accepted: Orders

## 2023-08-12 NOTE — Telephone Encounter (Signed)
Prescription Request  08/12/2023  Is this a "Controlled Substance" medicine? Yes  LOV: 05/04/2023  What is the name of the medication or equipment? amphetamine-dextroamphetamine (ADDERALL) 20 MG tablet  Have you contacted your pharmacy to request a refill? No   Which pharmacy would you like this sent to?  [id number] - [place], [place] - [address] [place] [address] Phone: [phone] Fax: [phone]    Patient notified that their request is being sent to the clinical staff for review and that they should receive a response within 2 business days.   Please advise at [facility] [phone]

## 2023-11-07 ENCOUNTER — Encounter: Payer: Self-pay | Admitting: Family Medicine

## 2023-11-07 ENCOUNTER — Ambulatory Visit (INDEPENDENT_AMBULATORY_CARE_PROVIDER_SITE_OTHER): Payer: Medicaid Other | Admitting: Family Medicine

## 2023-11-07 ENCOUNTER — Telehealth: Payer: Self-pay

## 2023-11-07 VITALS — BP 126/78 | HR 80 | Temp 98.0°F | Resp 16 | Ht 69.0 in | Wt 204.4 lb

## 2023-11-07 DIAGNOSIS — Z0000 Encounter for general adult medical examination without abnormal findings: Secondary | ICD-10-CM | POA: Diagnosis not present

## 2023-11-07 DIAGNOSIS — F902 Attention-deficit hyperactivity disorder, combined type: Secondary | ICD-10-CM | POA: Diagnosis not present

## 2023-11-07 DIAGNOSIS — Z79899 Other long term (current) drug therapy: Secondary | ICD-10-CM

## 2023-11-07 MED ORDER — AMPHETAMINE-DEXTROAMPHETAMINE 20 MG PO TABS
20.0000 mg | ORAL_TABLET | Freq: Every day | ORAL | 0 refills | Status: DC
Start: 2023-12-07 — End: 2024-03-29

## 2023-11-07 MED ORDER — AMPHETAMINE-DEXTROAMPHETAMINE 20 MG PO TABS
20.0000 mg | ORAL_TABLET | Freq: Every day | ORAL | 0 refills | Status: DC
Start: 2024-01-06 — End: 2024-03-29

## 2023-11-07 MED ORDER — AMPHETAMINE-DEXTROAMPHETAMINE 30 MG PO TABS
30.0000 mg | ORAL_TABLET | Freq: Every day | ORAL | 0 refills | Status: DC
Start: 2023-11-07 — End: 2024-03-29

## 2023-11-07 NOTE — Telephone Encounter (Signed)
Requesting: Adderall 20mg  Contract:11/07/2023 UDS:11/07/2023 Next Visit:NA

## 2023-11-07 NOTE — Patient Instructions (Addendum)
Keep the diet clean and stay active.  Do monthly self testicular checks in the shower. You are feeling for lumps/bumps that don't belong. If you feel anything like this, let me know!  Please get me a copy of your advanced directive form at your convenience.   Let us know if you need anything.  

## 2023-11-07 NOTE — Progress Notes (Signed)
Chief Complaint  Patient presents with   Annual Exam    Annual Exam    Well Male David Gray is here for a complete physical.   His last physical was >1 year ago.  Current diet: in general, a "healthy" diet.   Current exercise: lifting wts, running, walking Weight trend: intentionally gaining Fatigue out of ordinary? No. Seat belt? Yes.   Advanced directive? No  Health maintenance Tetanus- Yes HIV- Yes Hep C- Yes  Past Medical History:  Diagnosis Date   ADHD (attention deficit hyperactivity disorder)    dx  at age 60 or 38     Past Surgical History:  Procedure Laterality Date   NO PAST SURGERIES      Medications  Current Outpatient Medications on File Prior to Visit  Medication Sig Dispense Refill   amphetamine-dextroamphetamine (ADDERALL) 20 MG tablet Take 1 tablet (20 mg total) by mouth daily. 30 tablet 0   amphetamine-dextroamphetamine (ADDERALL) 20 MG tablet Take 1 tablet (20 mg total) by mouth daily. 30 tablet 0   amphetamine-dextroamphetamine (ADDERALL) 30 MG tablet Take 1 tablet by mouth daily. 30 tablet 0   Allergies No Known Allergies  Family History Family History  Problem Relation Age of Onset   Bipolar disorder Mother    Drug abuse Mother    Cancer Other        GGM breast ca   Bipolar disorder Father    Drug abuse Father    Bipolar disorder Maternal Grandmother    COPD Maternal Aunt    Diabetes Neg Hx    Coronary artery disease Neg Hx    Colon cancer Neg Hx    Prostate cancer Neg Hx     Review of Systems: Constitutional: no fevers or chills Eye:  no recent significant change in vision Ear/Nose/Mouth/Throat:  Ears:  no hearing loss Nose/Mouth/Throat:  no complaints of nasal congestion, no sore throat Cardiovascular:  no chest pain Respiratory:  no shortness of breath Gastrointestinal:  no abdominal pain, no change in bowel habits GU:  Male: negative for dysuria Musculoskeletal/Extremities: +L knee pain; otherwise no pain of the  joints Integumentary (Skin/Breast):  no abnormal skin lesions reported Neurologic:  no headaches Endocrine: No unexpected weight changes Hematologic/Lymphatic:  no night sweats  Exam BP 126/78 (BP Location: Left Arm, Patient Position: Sitting, Cuff Size: Normal)   Pulse 80   Temp 98 F (36.7 C) (Oral)   Resp 16   Ht 5\' 9"  (1.753 m)   Wt 204 lb 6.4 oz (92.7 kg)   SpO2 98%   BMI 30.18 kg/m  General:  well developed, well nourished, in no apparent distress Skin:  no significant moles, warts, or growths Head:  no masses, lesions, or tenderness Eyes:  pupils equal and round, sclera anicteric without injection Ears:  canals without lesions, TMs shiny without retraction, no obvious effusion, no erythema Nose:  nares patent, mucosa normal Throat/Pharynx:  lips and gingiva without lesion; tongue and uvula midline; non-inflamed pharynx; no exudates or postnasal drainage Neck: neck supple without adenopathy, thyromegaly, or masses Lungs:  clear to auscultation, breath sounds equal bilaterally, no respiratory distress Cardio:  regular rate and rhythm, no bruits, no LE edema Abdomen:  abdomen soft, nontender; bowel sounds normal; no masses or organomegaly Genital (male): Deferred Rectal: Deferred Musculoskeletal:  symmetrical muscle groups noted without atrophy or deformity Extremities:  no clubbing, cyanosis, or edema, no deformities, no skin discoloration Neuro:  gait normal; deep tendon reflexes normal and symmetric Psych: well oriented with  normal range of affect and appropriate judgment/insight  Assessment and Plan  Well adult exam  Attention deficit hyperactivity disorder (ADHD), combined type - Plan: amphetamine-dextroamphetamine (ADDERALL) 30 MG tablet, amphetamine-dextroamphetamine (ADDERALL) 20 MG tablet, amphetamine-dextroamphetamine (ADDERALL) 20 MG tablet  High risk medication use - Plan: Drug Monitoring Panel [phone] , Urine   Well 22 y.o. male. Counseled on diet and  exercise. Self testicular exams recommended at least monthly.  Advanced directive form provided today.  UDS and CSC today.  Other orders as above. Follow up in 6 mo pending the above workup. The patient voiced understanding and agreement to the plan.  David Gray [place], DO 11/07/23 10:10 AM

## 2023-11-09 LAB — DRUG MONITORING PANEL 376104, URINE
Amphetamines: NEGATIVE ng/mL (ref <500)
Barbiturates: NEGATIVE ng/mL (ref <300)
Benzodiazepines: NEGATIVE ng/mL (ref <100)
Cocaine Metabolite: NEGATIVE ng/mL (ref <150)
Desmethyltramadol: NEGATIVE ng/mL (ref <100)
Opiates: NEGATIVE ng/mL (ref <100)
Oxycodone: NEGATIVE ng/mL (ref <100)
Tramadol: NEGATIVE ng/mL (ref <100)

## 2023-11-09 LAB — DM TEMPLATE

## 2023-12-16 ENCOUNTER — Telehealth: Payer: Self-pay

## 2023-12-16 DIAGNOSIS — Z79890 Hormone replacement therapy (postmenopausal): Secondary | ICD-10-CM

## 2023-12-16 NOTE — Telephone Encounter (Signed)
Copied from CRM [id number]. Topic: Referral - Request for Referral >> Dec 16, 2023  4:06 PM Tiffany H wrote: Did the patient discuss referral with their provider in the last year? No (If No - schedule appointment) (If Yes - send message)  Appointment offered? Yes  Type of order/referral and detailed reason for visit: Patient advised that he takes testosterone and is concerned about how it will impact his fertility. Patient would like a referral to a specialist, if necessary. Please assist.   Preference of office, provider, location: Any location.   If referral order, have you been seen by this specialty before? No (If Yes, this issue or another issue? When? Where?  Can we respond through MyChart? Yes

## 2023-12-17 NOTE — Telephone Encounter (Signed)
OK to refer to urology, dx long term use of testosterone. Ty.

## 2023-12-19 NOTE — Telephone Encounter (Signed)
Referral placed.

## 2023-12-19 NOTE — Addendum Note (Signed)
Addended by: Maximino Sarin on: 12/19/2023 03:50 PM   Modules accepted: Orders

## 2023-12-30 ENCOUNTER — Encounter: Payer: Self-pay | Admitting: Family Medicine

## 2023-12-30 ENCOUNTER — Ambulatory Visit (INDEPENDENT_AMBULATORY_CARE_PROVIDER_SITE_OTHER): Payer: Medicaid Other | Admitting: Family Medicine

## 2023-12-30 VITALS — BP 120/78 | HR 91 | Temp 98.0°F | Resp 16 | Ht 69.0 in | Wt 211.2 lb

## 2023-12-30 DIAGNOSIS — N6342 Unspecified lump in left breast, subareolar: Secondary | ICD-10-CM

## 2023-12-30 DIAGNOSIS — M25531 Pain in right wrist: Secondary | ICD-10-CM

## 2023-12-30 NOTE — Patient Instructions (Signed)
 Continue using straps for lifting.   Wear the brace at night.   Heat (pad or rice pillow in microwave) over affected area, 10-15 minutes twice daily.   Ice/cold pack over area for 10-15 min twice daily.  Let us  know if you need anything.  Wrist and Forearm Exercises Do exercises exactly as told by your health care provider and adjust them as directed. It is normal to feel mild stretching, pulling, tightness, or discomfort as you do these exercises, but you should stop right away if you feel sudden pain or your pain gets worse.   RANGE OF MOTION EXERCISES These exercises warm up your muscles and joints and improve the movement and flexibility of your injured wrist and forearm. These exercises also help to relieve pain, numbness, and tingling. These exercises are done using the muscles in your injured wrist and forearm. Exercise A: Wrist Flexion, Active With your fingers relaxed, bend your wrist forward as far as you can. Hold this position for 30 seconds. Repeat 2 times. Complete this exercise 3 times per week. Exercise B: Wrist Extension, Active With your fingers relaxed, bend your wrist backward as far as you can. Hold this position for 30 seconds. Repeat 2 times. Complete this exercise 3 times per week. Exercise C: Supination, Active  Stand or sit with your arms at your sides. Bend your left / right elbow to an L shape (90 degrees). Turn your palm upward until you feel a gentle stretch on the inside of your forearm. Hold this position for 30 seconds. Slowly return your palm to the starting position. Repeat 2 times. Complete this exercise 3 times per week. Exercise D: Pronation, Active  Stand or sit with your arms at your sides. Bend your left / right elbow to an L shape (90 degrees). Turn your palm downward until you feel a gentle stretch on the top of your forearm. Hold this position for 30 seconds. Slowly return your palm to the starting position. Repeat 2 times.  Complete this exercise once a day.  STRETCHING EXERCISES These exercises warm up your muscles and joints and improve the movement and flexibility of your injured wrist and forearm. These exercises also help to relieve pain, numbness, and tingling. These exercises are done using your healthy wrist and forearm to help stretch the muscles in your injured wrist and forearm. Exercise E: Wrist Flexion, Passive  Extend your left / right arm in front of you, relax your wrist, and point your fingers downward. Gently push on the back of your hand. Stop when you feel a gentle stretch on the top of your forearm. Hold this position for 30 seconds. Repeat 2 times. Complete this exercise 3 times per week. Exercise F: Wrist Extension, Passive  Extend your left / right arm in front of you and turn your palm upward. Gently pull your palm and fingertips back so your fingers point downward. You should feel a gentle stretch on the palm-side of your forearm. Hold this position for 30 seconds. Repeat 2 times. Complete this exercise 3 times per week. Exercise G: Forearm Rotation, Supination, Passive Sit with your left / right elbow bent to an L shape (90 degrees) with your forearm resting on a table. Keeping your upper body and shoulder still, use your other hand to rotate your forearm palm-up until you feel a gentle to moderate stretch. Hold this position for 30 seconds. Slowly release the stretch and return to the starting position. Repeat 2 times. Complete this exercise 3 times per  week. Exercise H: Forearm Rotation, Pronation, Passive Sit with your left / right elbow bent to an L shape (90 degrees) with your forearm resting on a table. Keeping your upper body and shoulder still, use your other hand to rotate your forearm palm-down until you feel a gentle to moderate stretch. Hold this position for 30 seconds. Slowly release the stretch and return to the starting position. Repeat 2 times. Complete this  exercise 3 times per week.  STRENGTHENING EXERCISES These exercises build strength and endurance in your wrist and forearm. Endurance is the ability to use your muscles for a long time, even after they get tired. Exercise I: Wrist Flexors  Sit with your left / right forearm supported on a table and your hand resting palm-up over the edge of the table. Your elbow should be bent to an L shape (about 90 degrees) and be below the level of your shoulder. Hold a 3-5 lb weight in your left / right hand. Or, hold a rubber exercise band or tube in both hands, keeping your hands at the same level and hip distance apart. There should be a slight tension in the exercise band or tube. Slowly curl your hand up toward your forearm. Hold this position for 3 seconds. Slowly lower your hand back to the starting position. Repeat 2 times. Complete this exercise 3 times per week. Exercise J: Wrist Extensors  Sit with your left / right forearm supported on a table and your hand resting palm-down over the edge of the table. Your elbow should be bent to an L shape (about 90 degrees) and be below the level of your shoulder. Hold a 3-5 lb weight in your left / right hand. Or, hold a rubber exercise band or tube in both hands, keeping your hands at the same level and hip distance apart. There should be a slight tension in the exercise band or tube. Slowly curl your hand up toward your forearm. Hold this position for 3 seconds. Slowly lower your hand back to the starting position. Repeat 2 times. Complete this exercise 3 times per week. Exercise K: Forearm Rotation, Supination  Sit with your left / right forearm supported on a table and your hand resting palm-down. Your elbow should be at your side, bent to an L shape (about 90 degrees), and below the level of your shoulder. Keep your wrist stable and in a neutral position throughout the exercise. Gently hold a lightweight hammer with your left / right  hand. Without moving your elbow or wrist, slowly rotate your palm upward to a thumbs-up position. Hold this position for 3 seconds. Slowly return your forearm to the starting position. Repeat 2 times. Complete this exercise 3 times per week. Exercise L: Forearm Rotation, Pronation  Sit with your left / right forearm supported on a table and your hand resting palm-up. Your elbow should be at your side, bent to an L shape (about 90 degrees), and below the level of your shoulder. Keep your wrist stable. Do not allow it to move backward or forward during the exercise. Gently hold a lightweight hammer with your left / right hand. Without moving your elbow or wrist, slowly rotate your palm and hand upward to a thumbs-up position. Hold this position for 3 seconds. Slowly return your forearm to the starting position. Repeat 2 times. Complete this exercise 3 times per week. Exercise M: Grip Strengthening  Hold one of these items in your left / right hand: play dough, therapy putty, a  dense sponge, a stress ball, or a large, rolled sock. Squeeze as hard as you can without increasing pain. Hold this position for 5 seconds. Slowly release your grip. Repeat 2 times. Complete this exercise 3 times per week.  This information is not intended to replace advice given to you by your health care provider. Make sure you discuss any questions you have with your health care provider. Document Released: 10/27/2005 Document Revised: 09/06/2016 Document Reviewed: 09/07/2015 Elsevier Interactive Patient Education  [employer].

## 2023-12-30 NOTE — Progress Notes (Signed)
 Chief Complaint  Patient presents with   Follow-up    Follow up    David Gray is a 23 y.o. male here for a skin complaint.  Duration: 10 days Location: L breast Pruritic? No Painful? No Drainage? No New soaps/lotions/topicals/detergents? No Trauma? No Other associated symptoms: not changing, no overlying skin changes Therapies tried thus far: None  R wrist Started 2 weeks ago. No inj. Did do heavy curls. Has been icing and using wrist. Slightly improved. Supination helps. No bruising, redness, swelling, decreased ROM. No neuro s/s's.   Past Medical History:  Diagnosis Date   ADHD (attention deficit hyperactivity disorder)    dx  at age 67 or 5    BP 120/78   Pulse 91   Temp 98 F (36.7 C) (Oral)   Resp 16   Ht 5' 9 (1.753 m)   Wt 211 lb 3.2 oz (95.8 kg)   SpO2 97%   BMI 31.19 kg/m  Gen: awake, alert, appearing stated age Lungs: No accessory muscle use MSK: TTP over flexor carpi ulnaris tendon on R without edema or deformity, no erythema Skin: L breast region: No gross changes. 1 cm x 0.5 cm nodular lesion over medial upper quad of areola, no dc, freely moveable. No drainage, erythema, TTP, fluctuance, excoriation Psych: Age appropriate judgment and insight  Subareolar mass of left breast - Plan: US  BREAST COMPLETE UNI LEFT INC AXILLA  Right wrist pain  Ck US  to be on safe side. Could be cyst or gynecomastia. Ice, Tylenol, stretches/exercises, wrist brace at night and prn. Cont w wrist straps with lifts. Activity as tolerated. Sports med if no better.  F/u prn. The patient voiced understanding and agreement to the plan.  Juvencio Verdi Raiford, DO 12/30/23 3:01 PM

## 2024-01-02 ENCOUNTER — Other Ambulatory Visit: Payer: Self-pay | Admitting: Family Medicine

## 2024-01-02 DIAGNOSIS — N6342 Unspecified lump in left breast, subareolar: Secondary | ICD-10-CM

## 2024-01-05 ENCOUNTER — Ambulatory Visit: Payer: Medicaid Other | Admitting: Urology

## 2024-01-10 ENCOUNTER — Inpatient Hospital Stay: Admission: RE | Admit: 2024-01-10 | Payer: Medicaid Other | Source: Ambulatory Visit

## 2024-02-02 ENCOUNTER — Ambulatory Visit: Payer: Medicaid Other | Admitting: Urology

## 2024-02-02 NOTE — Progress Notes (Deleted)
   Assessment: No diagnosis found.   Plan: ***  Chief Complaint:   History of Present Illness:  David Gray is a 23 y.o. male who is seen in consultation from [place], Mabel Mt, DO regarding long-term use of testosterone.   Past Medical History:  Past Medical History:  Diagnosis Date   ADHD (attention deficit hyperactivity disorder)    dx  at age 17 or 62    Past Surgical History:  Past Surgical History:  Procedure Laterality Date   NO PAST SURGERIES      Allergies:  No Known Allergies  Family History:  Family History  Problem Relation Age of Onset   Bipolar disorder Mother    Drug abuse Mother    Cancer Other        GGM breast ca   Bipolar disorder Father    Drug abuse Father    Bipolar disorder Maternal Grandmother    COPD Maternal Aunt    Diabetes Neg Hx    Coronary artery disease Neg Hx    Colon cancer Neg Hx    Prostate cancer Neg Hx     Social History:  Social History   Tobacco Use   Smoking status: Never   Smokeless tobacco: Never   Tobacco comments:    mother smokes outside  Vaping Use   Vaping status: Never Used  Substance Use Topics   Alcohol use: Not Currently   Drug use: Yes    Types: Marijuana    Review of symptoms:  Constitutional:  Negative for unexplained weight loss, night sweats, fever, chills ENT:  Negative for nose bleeds, sinus pain, painful swallowing CV:  Negative for chest pain, shortness of breath, exercise intolerance, palpitations, loss of consciousness Resp:  Negative for cough, wheezing, shortness of breath GI:  Negative for nausea, vomiting, diarrhea, bloody stools GU:  Positives noted in HPI; otherwise negative for gross hematuria, dysuria, urinary incontinence Neuro:  Negative for seizures, poor balance, limb weakness, slurred speech Psych:  Negative for lack of energy, depression, anxiety Endocrine:  Negative for polydipsia, polyuria, symptoms of hypoglycemia (dizziness, hunger,  sweating) Hematologic:  Negative for anemia, purpura, petechia, prolonged or excessive bleeding, use of anticoagulants  Allergic:  Negative for difficulty breathing or choking as a result of exposure to anything; no shellfish allergy; no allergic response (rash/itch) to materials, foods  Physical exam: There were no vitals taken for this visit. GENERAL APPEARANCE:  Well appearing, well developed, well nourished, NAD HEENT: Atraumatic, Normocephalic, oropharynx clear. NECK: Supple without lymphadenopathy or thyromegaly. LUNGS: Clear to auscultation bilaterally. HEART: Regular Rate and Rhythm without murmurs, gallops, or rubs. ABDOMEN: Soft, non-tender, No Masses. EXTREMITIES: Moves all extremities well.  Without clubbing, cyanosis, or edema. NEUROLOGIC:  Alert and oriented x 3, normal gait, CN II-XII grossly intact.  MENTAL STATUS:  Appropriate. BACK:  Non-tender to palpation.  No CVAT SKIN:  Warm, dry and intact.    Results: No results found for this or any previous visit (from the past 24 hours).

## 2024-03-29 ENCOUNTER — Other Ambulatory Visit: Payer: Self-pay | Admitting: Family Medicine

## 2024-03-29 DIAGNOSIS — F902 Attention-deficit hyperactivity disorder, combined type: Secondary | ICD-10-CM

## 2024-03-29 MED ORDER — AMPHETAMINE-DEXTROAMPHETAMINE 20 MG PO TABS: 20.0000 mg | ORAL_TABLET | Freq: Every day | ORAL | 0 refills | Status: DC

## 2024-03-29 NOTE — Telephone Encounter (Signed)
 Copied from CRM [id number]. Topic: Clinical - Medication Refill >> Mar 29, 2024 11:11 AM Denese Killings wrote: Most Recent Primary Care Visit:  Provider: Sharlene Dory  Department: [facility]  Visit Type: OFFICE VISIT  Date: 12/30/2023  Medication: amphetamine-dextroamphetamine (ADDERALL) 20 MG tablet  Has the patient contacted their pharmacy? No (Agent: If no, request that the patient contact the pharmacy for the refill. If patient does not wish to contact the pharmacy document the reason why and proceed with request.) (Agent: If yes, when and what did the pharmacy advise?) has to call doctor   Is this the correct pharmacy for this prescription? Yes If no, delete pharmacy and type the correct one.  This is the patient's preferred pharmacy:  [id number] - [place], [place] - [address] [place] [address] Phone: [phone] Fax: [phone]   Has the prescription been filled recently? No  Is the patient out of the medication? Yes  Has the patient been seen for an appointment in the last year OR does the patient have an upcoming appointment? Yes  Can we respond through MyChart? Yes  Agent: Please be advised that Rx refills may take up to 3 business days. We ask that you follow-up with your pharmacy.

## 2024-03-29 NOTE — Telephone Encounter (Signed)
 Requesting: Adderall 30 mg Contract: 12/23/2023 UDS: 11/07/2023 Last Visit: 12/30/2023 Next Visit: 05/07/2024 Last Refill: 01/06/2024  Please Advise

## 2024-03-29 NOTE — Telephone Encounter (Signed)
 Copied from CRM [id number]. Topic: Clinical - Medication Refill >> Mar 29, 2024 11:14 AM Denese Killings wrote: Most Recent Primary Care Visit:  Provider: Sharlene Dory  Department: [facility]  Visit Type: OFFICE VISIT  Date: 12/30/2023  Medication: amphetamine-dextroamphetamine (ADDERALL) 30 MG tablet  Has the patient contacted their pharmacy? No (Agent: If no, request that the patient contact the pharmacy for the refill. If patient does not wish to contact the pharmacy document the reason why and proceed with request.) (Agent: If yes, when and what did the pharmacy advise?)has to call doctor   Is this the correct pharmacy for this prescription? Yes If no, delete pharmacy and type the correct one.  This is the patient's preferred pharmacy:  [id number] - [place], [place] - [address] [place]  [address] Phone: [phone] Fax: [phone]   Has the prescription been filled recently? No  Is the patient out of the medication? Yes  Has the patient been seen for an appointment in the last year OR does the patient have an upcoming appointment? Yes  Can we respond through MyChart? Yes  Agent: Please be advised that Rx refills may take up to 3 business days. We ask that you follow-up with your pharmacy.

## 2024-05-07 ENCOUNTER — Encounter: Payer: Self-pay | Admitting: Family Medicine

## 2024-05-07 ENCOUNTER — Ambulatory Visit (INDEPENDENT_AMBULATORY_CARE_PROVIDER_SITE_OTHER): Payer: Medicaid Other | Admitting: Family Medicine

## 2024-05-07 VITALS — BP 128/78 | HR 99 | Temp 98.0°F | Resp 16 | Ht 69.0 in | Wt 212.0 lb

## 2024-05-07 DIAGNOSIS — F902 Attention-deficit hyperactivity disorder, combined type: Secondary | ICD-10-CM | POA: Diagnosis not present

## 2024-05-07 NOTE — Patient Instructions (Signed)
 Please call: [phone]  Let us  know if you need anything.

## 2024-05-07 NOTE — Progress Notes (Signed)
 Chief Complaint  Patient presents with   Follow-up    Follow Up 6 month    David Gray is 23 y.o. male here for ADHD follow up.  Patient is currently on Adderall 20 mg/d and compliance is excellent. Symptoms include: none. Side effects include: none. Patient believes their dose should be not significantly changed. Denies tics, weight loss, difficulties with sleep, self-medication, alcohol/drug abuse, chest pain, or palpitations.  Past Medical History:  Diagnosis Date   ADHD (attention deficit hyperactivity disorder)    dx  at age 74 or 5    BP 128/78 (BP Location: Left Arm, Patient Position: Sitting)   Pulse 99   Temp 98 F (36.7 C) (Oral)   Resp 16   Ht 5\' 9"  (1.753 m)   Wt 212 lb (96.2 kg)   SpO2 98%   BMI 31.31 kg/m  Gen- awake, alert, appearing stated age Heart- RRR Lungs- CTAB, no accessory muscle use Neuro- no facial tics Psych- age appropriate judgment and insight, normal mood and affect  Attention deficit hyperactivity disorder (ADHD), combined type  Chronic, stable. Cont Adderall 20 mg/d.  F/u in 6 mo for CPE. Pt voiced understanding and agreement to the plan.  Almer Lancer [place], DO 05/07/24 10:05 AM

## 2024-05-31 ENCOUNTER — Ambulatory Visit: Payer: Self-pay | Admitting: Physician Assistant

## 2024-05-31 ENCOUNTER — Encounter: Payer: Self-pay | Admitting: Physician Assistant

## 2024-05-31 ENCOUNTER — Ambulatory Visit (INDEPENDENT_AMBULATORY_CARE_PROVIDER_SITE_OTHER): Admitting: Physician Assistant

## 2024-05-31 ENCOUNTER — Ambulatory Visit (HOSPITAL_BASED_OUTPATIENT_CLINIC_OR_DEPARTMENT_OTHER)
Admission: RE | Admit: 2024-05-31 | Discharge: 2024-05-31 | Disposition: A | Discharge status: Home / Self Care | Source: Ambulatory Visit | Attending: Physician Assistant | Admitting: Physician Assistant

## 2024-05-31 ENCOUNTER — Ambulatory Visit: Payer: Self-pay

## 2024-05-31 VITALS — BP 108/65 | HR 63 | Ht 69.0 in | Wt 210.0 lb

## 2024-05-31 DIAGNOSIS — G510 Bell's palsy: Secondary | ICD-10-CM | POA: Insufficient documentation

## 2024-05-31 LAB — CBC WITH DIFFERENTIAL/PLATELET
Basophils Absolute: 0.1 10*3/uL (ref 0.0–0.1)
Basophils Relative: 1.3 % (ref 0.0–3.0)
Eosinophils Absolute: 0.2 10*3/uL (ref 0.0–0.7)
Eosinophils Relative: 3.1 % (ref 0.0–5.0)
HCT: 51.3 % (ref 39.0–52.0)
Hemoglobin: 17 g/dL (ref 13.0–17.0)
Lymphocytes Relative: 33.6 % (ref 12.0–46.0)
Lymphs Abs: 1.7 10*3/uL (ref 0.7–4.0)
MCHC: 33.1 g/dL (ref 30.0–36.0)
MCV: 86.4 fl (ref 78.0–100.0)
Monocytes Absolute: 0.7 10*3/uL (ref 0.1–1.0)
Monocytes Relative: 13.3 % — ABNORMAL HIGH (ref 3.0–12.0)
Neutro Abs: 2.5 10*3/uL (ref 1.4–7.7)
Neutrophils Relative %: 48.7 % (ref 43.0–77.0)
Platelets: 232 10*3/uL (ref 150.0–400.0)
RBC: 5.93 Mil/uL — ABNORMAL HIGH (ref 4.22–5.81)
RDW: 15.4 % (ref 11.5–15.5)
WBC: 5.1 10*3/uL (ref 4.0–10.5)

## 2024-05-31 LAB — SEDIMENTATION RATE: Sed Rate: <1 mm/h (ref 0–15)

## 2024-05-31 MED ORDER — PREDNISONE 20 MG PO TABS: 60.0000 mg | ORAL_TABLET | Freq: Every day | ORAL | 0 refills | Status: AC

## 2024-05-31 MED ORDER — VALACYCLOVIR HCL 1 G PO TABS: 1000.0000 mg | ORAL_TABLET | Freq: Three times a day (TID) | ORAL | 0 refills | Status: AC

## 2024-05-31 NOTE — Telephone Encounter (Signed)
                 FYI Only or Action Required?: Action required by provider  Patient was last seen in primary care on 05/07/2024 by Jobe Mulder, DO. Called Nurse Triage reporting Neurologic Problem. Symptoms began yesterday. Interventions attempted: Nothing. Symptoms are: unchanged.No other symptoms, no headache or dizziness.   Triage Disposition: See HCP Within 4 Hours (Or PCP Triage)  Patient/caregiver understands and will follow disposition?: YesCopied from CRM [id number]. Topic: Clinical - Red Word Triage >> May 31, 2024  7:31 AM Rosamond Comes wrote: Red Word that prompted transfer to Nurse Triage: patient lost movement in lower left lip, started 05/30/24 Reason for Disposition  [1] Tingling (e.g., pins and needles) of the face, arm / hand, or leg / foot on one side of the body AND [2] present now (Exceptions: Chronic or recurrent symptom lasting > 4 weeks; or tingling from known cause, such as: bumped elbow, carpal tunnel syndrome, pinched nerve, frostbite.)  Answer Assessment - Initial Assessment Questions 1. SYMPTOM: "What is the main symptom you are concerned about?" (e.g., weakness, numbness)     Left lower lip not moving 2. ONSET: "When did this start?" (minutes, hours, days; while sleeping)     Yesterday 3. LAST NORMAL: "When was the last time you (the patient) were normal (no symptoms)?"     2 days ago 4. PATTERN "Does this come and go, or has it been constant since it started?"  "Is it present now?"     constant 5. CARDIAC SYMPTOMS: "Have you had any of the following symptoms: chest pain, difficulty breathing, palpitations?"     no 6. NEUROLOGIC SYMPTOMS: "Have you had any of the following symptoms: headache, dizziness, vision loss, double vision, changes in speech, unsteady on your feet?"     no 7. OTHER SYMPTOMS: "Do you have any other symptoms?"     no 8. PREGNANCY: "Is there any chance you are pregnant?" "When was your last menstrual period?"      N/a  Protocols used: Neurologic Deficit-A-AH

## 2024-05-31 NOTE — Progress Notes (Addendum)
 Established patient visit   Patient: David Gray   DOB: 2001/07/03   23 y.o. Male  MRN: [id number] Visit Date: 05/31/2024  Today's healthcare provider: Trenton Frock, PA-C   Cc. Face paralysis  Subjective     Pt reports yesterday, he was eating and started to feel numbness of the left side of his mouth/tongue. He looked in the mirror, and noticed the whole left side of his face looked different. He called an EMS friend who walked him through stroke testing which was negative.  Denies any other weakness, paresthesias. No other unilateral symptoms. Denies any recent illness, fever, body aches. Denies URI symptoms, GI symptoms, urinary symptoms. No recent injury. Denies any recent tick bites.   Only sick exposure, one of his children recently has strep. Denies any symptoms personally.  Only pain is left side upper cervical spine/occipital is tight and uncomfortable. No rashes.  Medications: Outpatient Medications Prior to Visit  Medication Sig   amphetamine -dextroamphetamine  (ADDERALL) 20 MG tablet Take 1 tablet (20 mg total) by mouth daily.   amphetamine -dextroamphetamine  (ADDERALL) 20 MG tablet Take 1 tablet (20 mg total) by mouth daily.   amphetamine -dextroamphetamine  (ADDERALL) 20 MG tablet Take 1 tablet (20 mg total) by mouth daily.   No facility-administered medications prior to visit.    Review of Systems  Constitutional:  Negative for fatigue and fever.  Respiratory:  Negative for cough and shortness of breath.   Cardiovascular:  Negative for chest pain, palpitations and leg swelling.  Neurological:  Positive for facial asymmetry. Negative for dizziness and headaches.       Objective    BP 108/65   Pulse 63   Ht 5\' 9"  (1.753 m)   Wt 210 lb (95.3 kg)   BMI 31.01 kg/m    Physical Exam Vitals reviewed.  Constitutional:      Appearance: He is not ill-appearing.  HENT:     Head: Normocephalic.     Right Ear: Tympanic membrane normal.     Left  Ear: Tympanic membrane normal.  Eyes:     Conjunctiva/sclera: Conjunctivae normal.  Cardiovascular:     Rate and Rhythm: Normal rate.  Pulmonary:     Effort: Pulmonary effort is normal. No respiratory distress.  Neurological:     Mental Status: He is alert and oriented to person, place, and time.     Cranial Nerves: Facial asymmetry present.     Sensory: Sensation is intact.     Motor: Motor function is intact. No weakness.     Coordination: Coordination is intact.     Gait: Gait is intact.     Comments: L forehead with no movement Incomplete L eye closure Downturning of L side of mouth  Housebrackman grade 3-4   Psychiatric:        Mood and Affect: Mood normal.        Behavior: Behavior normal.      No results found for any visits on 05/31/24.  Assessment & Plan    Bell's palsy No acute neuro deficits. Facial asymmetry pattern aligns with bell's palsy. No known pre-disposing condition. Will check labs, ct head. Based on appearance a housebrackman grade 3-4, will tx prednisone  and valacyclovir.  Recommending gel rewetting eye drops q 6 hr and before bed. Recommending taping L eye shut.   F/b 2 weeks -     Lyme Disease Serology w/Reflex -     CBC with Differential/Platelet -     Comprehensive metabolic panel with GFR -  C-reactive protein -     Sedimentation rate -     predniSONE ; Take 3 tablets (60 mg total) by mouth daily with breakfast for 7 days.  Dispense: 21 tablet; Refill: 0 -     valACYclovir HCl; Take 1 tablet (1,000 mg total) by mouth 3 (three) times daily for 7 days.  Dispense: 21 tablet; Refill: 0 -     CT HEAD WO CONTRAST ( )  Return in about 2 weeks (around 06/14/2024) for bells palsy.       Trenton Frock, PA-C  [facility] [phone] (phone) [phone] (fax)  [facility]

## 2024-06-01 ENCOUNTER — Ambulatory Visit (HOSPITAL_BASED_OUTPATIENT_CLINIC_OR_DEPARTMENT_OTHER)

## 2024-06-01 LAB — COMPREHENSIVE METABOLIC PANEL WITH GFR
ALT: 38 U/L (ref 0–53)
AST: 37 U/L (ref 0–37)
Albumin: 4.7 g/dL (ref 3.5–5.2)
Alkaline Phosphatase: 77 U/L (ref 39–117)
BUN: 15 mg/dL (ref 6–23)
CO2: 25 meq/L (ref 19–32)
Calcium: 10 mg/dL (ref 8.4–10.5)
Chloride: 104 meq/L (ref 96–112)
Creatinine, Ser: 1 mg/dL (ref 0.40–1.50)
GFR: 105.16 mL/min (ref >60.00)
Glucose, Bld: 94 mg/dL (ref 70–99)
Potassium: 4.3 meq/L (ref 3.5–5.1)
Sodium: 139 meq/L (ref 135–145)
Total Bilirubin: 0.5 mg/dL (ref 0.2–1.2)
Total Protein: 7.1 g/dL (ref 6.0–8.3)

## 2024-06-01 LAB — C-REACTIVE PROTEIN: CRP: <1 mg/dL (ref 0.5–20.0)

## 2024-06-01 LAB — LYME DISEASE SEROLOGY W/REFLEX: Lyme Total Antibody EIA: NEGATIVE

## 2024-06-12 ENCOUNTER — Ambulatory Visit (INDEPENDENT_AMBULATORY_CARE_PROVIDER_SITE_OTHER): Admitting: Family Medicine

## 2024-06-12 ENCOUNTER — Encounter: Payer: Self-pay | Admitting: Family Medicine

## 2024-06-12 VITALS — BP 124/76 | HR 58 | Temp 98.0°F | Resp 16 | Ht 69.0 in | Wt 207.0 lb

## 2024-06-12 DIAGNOSIS — J029 Acute pharyngitis, unspecified: Secondary | ICD-10-CM | POA: Diagnosis not present

## 2024-06-12 LAB — POCT RAPID STREP A (OFFICE): Rapid Strep A Screen: NEGATIVE

## 2024-06-12 MED ORDER — AMOXICILLIN 500 MG PO CAPS: 1000.0000 mg | ORAL_CAPSULE | Freq: Every day | ORAL | 0 refills | Status: DC

## 2024-06-12 NOTE — Patient Instructions (Signed)
 Ibuprofen 400-600 mg (2-3 over the counter strength tabs) every 6 hours as needed for pain.  OK to take Tylenol 1000 mg (2 extra strength tabs) or 975 mg (3 regular strength tabs) every 6 hours as needed.  Throw out toothbrush after 24 hrs of antibiotics.   The culture should be back in 2-3 days. If it is negative, we will stop the antibiotic.   Consider throat lozenges, salt water gargles and an air humidifier for symptomatic care.   Let us  know if you need anything.

## 2024-06-12 NOTE — Addendum Note (Signed)
 Addended by: Dionysios Massman M on: 06/12/2024 04:05 PM   Modules accepted: Orders

## 2024-06-12 NOTE — Progress Notes (Signed)
 SUBJECTIVE:   David Gray is a 23 y.o. male presents to the clinic for: ST   Complains of sore throat for 4 days.  Other associated symptoms: 101 F fever, sore throat.  Denies: sinus congestion, sinus pain, rhinorrhea, itchy watery eyes, ear pain, ear drainage, sore throat, wheezing, shortness of breath, myalgia, and fevers Sick Contacts: son- RSV and strep Therapy to date: ibuprofen  Social History   Tobacco Use  Smoking Status Never  Smokeless Tobacco Never  Tobacco Comments   mother smokes outside    Patient's medications, allergies, past medical, surgical, social and family histories were reviewed and updated as appropriate.  OBJECTIVE:  BP 124/76 (BP Location: Left Arm, Patient Position: Sitting)   Pulse (!) 58   Temp 98 F (36.7 C) (Oral)   Resp 16   Ht 5' 9 (1.753 m)   Wt 207 lb (93.9 kg)   SpO2 98%   BMI 30.57 kg/m  General: Awake, alert, appearing stated age Eyes: conjunctivae and sclerae clear Ears: normal TMs bilaterally Nose: no visible exudate Oropharynx: erythematous without exudate Neck: supple, no significant adenopathy Lungs: clear to auscultation, no wheezes, rales or rhonchi, symmetric air entry, normal effort Heart: Reg rhythm, bradycardic Skin:reveals no rash, diaphoretic Psych: Age appropriate judgment and insight  ASSESSMENT/PLAN:  Sore throat  Continue to practice good hand hygiene and push fluids. Rapid strep neg. Fever broke per pt. Empirically tx for strep given sore throat lingering and exposure to son dx'd w strep. If cx neg, will stop abx.  Ibuprofen and acetaminophen for pain. Replace toothbrush after 24 hours of being on abx. F/u prn. Pt voiced understanding and agreement to the plan.  Jeanmarc Viernes [place], DO 06/12/24 3:09 PM

## 2024-06-14 ENCOUNTER — Ambulatory Visit: Payer: Self-pay | Admitting: Family Medicine

## 2024-06-14 LAB — CULTURE, GROUP A STREP
Micro Number: 16590507
SPECIMEN QUALITY:: ADEQUATE

## 2024-06-15 ENCOUNTER — Ambulatory Visit (INDEPENDENT_AMBULATORY_CARE_PROVIDER_SITE_OTHER): Admitting: Family Medicine

## 2024-06-15 ENCOUNTER — Encounter: Payer: Self-pay | Admitting: Family Medicine

## 2024-06-15 VITALS — BP 118/70 | HR 87 | Temp 98.0°F | Resp 16 | Ht 69.0 in | Wt 207.0 lb

## 2024-06-15 DIAGNOSIS — J029 Acute pharyngitis, unspecified: Secondary | ICD-10-CM | POA: Diagnosis not present

## 2024-06-15 DIAGNOSIS — G510 Bell's palsy: Secondary | ICD-10-CM

## 2024-06-15 NOTE — Patient Instructions (Signed)
 OK to take Tylenol 1000 mg (2 extra strength tabs) or 975 mg (3 regular strength tabs) every 6 hours as needed.  Consider throat lozenges, salt water gargles and an air humidifier for symptomatic care.   Let me know if you need a refill of your syrup.   Let us  know if you need anything.

## 2024-06-15 NOTE — Progress Notes (Signed)
 Chief Complaint  Patient presents with   Follow-up    Follow Up    Subjective: Patient is a 23 y.o. male here for Bell's Palsy f/u.  Doing mostly better. L side of face was paralyzed. Placed on Valtrex  and prednisone . Finished those courses. Getting over a bad sore throat/cough.   Past Medical History:  Diagnosis Date   ADHD (attention deficit hyperactivity disorder)    dx  at age 50 or 5    Objective: BP 118/70 (BP Location: Left Arm, Patient Position: Sitting)   Pulse 87   Temp 98 F (36.7 C) (Oral)   Resp 16   Ht 5' 9 (1.753 m)   Wt 207 lb (93.9 kg)   SpO2 97%   BMI 30.57 kg/m  General: Awake, appears stated age Heart: RRR Neuro: Gait nml, no obvious facial droop. L eyebrow and side of face do not rise equally compared to R. Sensation intact to light touch.  Lungs: CTAB, no rales, wheezes or rhonchi. No accessory muscle use Psych: Age appropriate judgment and insight, normal affect and mood  Assessment and Plan: Bell's palsy  Sore throat  Improving. No further intervention needed. Improving. Tylenol, throat lozenges, has cough syrup at home. F/u as originally scheduled.  The patient voiced understanding and agreement to the plan.  Zavior Thomason [place], DO 06/15/24  12:45 PM

## 2024-06-18 ENCOUNTER — Ambulatory Visit (INDEPENDENT_AMBULATORY_CARE_PROVIDER_SITE_OTHER): Admitting: Family Medicine

## 2024-06-18 ENCOUNTER — Encounter: Payer: Self-pay | Admitting: Family Medicine

## 2024-06-18 VITALS — BP 112/68 | HR 100 | Temp 98.0°F | Resp 16 | Ht 69.0 in | Wt 207.0 lb

## 2024-06-18 DIAGNOSIS — J40 Bronchitis, not specified as acute or chronic: Secondary | ICD-10-CM

## 2024-06-18 MED ORDER — PREDNISONE 20 MG PO TABS: 40.0000 mg | ORAL_TABLET | Freq: Every day | ORAL | 0 refills | Status: AC

## 2024-06-18 MED ORDER — AZITHROMYCIN 250 MG PO TABS: ORAL_TABLET | ORAL | 0 refills | Status: AC

## 2024-06-18 NOTE — Progress Notes (Signed)
 Chief Complaint  Patient presents with   Cough    Cough     David Gray D Cozzolino here for URI complaints.  Duration: 10 days - worsening 2 d ago Associated symptoms: rhinorrhea, wheezing, shortness of breath, and coughing Denies: sinus congestion, sinus pain, itchy watery eyes, ear pain, ear drainage, sore throat, myalgia, and fevers Treatment to date: Tylenol, ibuprofen, Tessalon  Perles, Phenergan -DM Sick contacts: No  Past Medical History:  Diagnosis Date   ADHD (attention deficit hyperactivity disorder)    dx  at age 89 or 5    Objective BP 112/68 (BP Location: Left Arm, Patient Position: Sitting)   Pulse 100   Temp 98 F (36.7 C) (Oral)   Resp 16   Ht 5' 9 (1.753 m)   Wt 207 lb (93.9 kg)   SpO2 98%   BMI 30.57 kg/m  General: Awake, alert, appears stated age HEENT: AT, [place], ears patent b/l and TM's neg, nares patent w/o discharge, pharynx pink and without exudates, MMM Neck: No masses or asymmetry Heart: RRR Lungs: faint wheezes at bases, no accessory muscle use Psych: Age appropriate judgment and insight, normal mood and affect  Wheezy bronchitis - Plan: azithromycin (ZITHROMAX) 250 MG tablet, predniSONE  (DELTASONE ) 20 MG tablet  5 d pred burst 40 mg/d. If no improvement in next 1-2 d, take Zpak. Continue to push fluids, practice good hand hygiene, cover mouth when coughing. F/u prn. If starting to experience fevers, shaking, or worsening shortness of breath, seek immediate care. Pt voiced understanding and agreement to the plan.  David Gray [place], DO 06/18/24 3:22 PM

## 2024-06-18 NOTE — Patient Instructions (Addendum)
 Continue to push fluids, practice good hand hygiene, and cover your mouth if you cough.  If you start having fevers, shaking or shortness of breath, seek immediate care.  OK to take Tylenol 1000 mg (2 extra strength tabs) or 975 mg (3 regular strength tabs) every 6 hours as needed.  Take prednisone  daily for 5 days. If no significant improvement in the next 2 days, take the antibiotic (azithromycin).   Let us  know if you need anything.

## 2024-06-27 ENCOUNTER — Other Ambulatory Visit: Payer: Self-pay | Admitting: Family Medicine

## 2024-06-27 DIAGNOSIS — F902 Attention-deficit hyperactivity disorder, combined type: Secondary | ICD-10-CM

## 2024-06-27 MED ORDER — AMPHETAMINE-DEXTROAMPHETAMINE 20 MG PO TABS
20.0000 mg | ORAL_TABLET | Freq: Every day | ORAL | 0 refills | Status: DC
Start: 2024-08-26 — End: 2024-10-02

## 2024-06-27 MED ORDER — AMPHETAMINE-DEXTROAMPHETAMINE 20 MG PO TABS: 20.0000 mg | ORAL_TABLET | Freq: Every day | ORAL | 0 refills | Status: DC

## 2024-06-27 NOTE — Telephone Encounter (Signed)
 Copied from CRM [id number]. Topic: Clinical - Medication Refill >> Jun 27, 2024  1:22 PM Chasity T wrote: Medication: amphetamine -dextroamphetamine  (ADDERALL) 20 MG tablet   Has the patient contacted their pharmacy? Yes (Agent: If no, request that the patient contact the pharmacy for the refill. If patient does not wish to contact the pharmacy document the reason why and proceed with request.) (Agent: If yes, when and what did the pharmacy advise?)  This is the patient's preferred pharmacy:  [id number] - [place], [place] - [address] [place] [address] Phone: [phone] Fax: [phone]  Is this the correct pharmacy for this prescription? Yes   Has the prescription been filled recently? No  Is the patient out of the medication? Yes  Has the patient been seen for an appointment in the last year OR does the patient have an upcoming appointment? Yes  Can we respond through MyChart? Yes  Agent: Please be advised that Rx refills may take up to 3 business days. We ask that you follow-up with your pharmacy.

## 2024-06-27 NOTE — Telephone Encounter (Signed)
 Requesting: Adderall 20mg  Contract: 11/07/23 UDS: 11/07/23 Last Visit: 06/18/24 Next Visit: 11/07/24 Last Refill: 05/28/24 #30 and 0RF   Please Advise

## 2024-10-02 ENCOUNTER — Other Ambulatory Visit: Payer: Self-pay | Admitting: Family Medicine

## 2024-10-02 ENCOUNTER — Telehealth: Payer: Self-pay

## 2024-10-02 DIAGNOSIS — F902 Attention-deficit hyperactivity disorder, combined type: Secondary | ICD-10-CM

## 2024-10-02 MED ORDER — AMPHETAMINE-DEXTROAMPHETAMINE 20 MG PO TABS: 20.0000 mg | ORAL_TABLET | Freq: Every day | ORAL | 0 refills | Status: DC

## 2024-10-02 NOTE — Telephone Encounter (Signed)
 Requesting: Adderall 20mg   Contract: 11/07/23 UDS: 11/07/23 Last Visit: 06/18/24 Next Visit: None Last Refill:see med list   Please Advise

## 2024-10-02 NOTE — Telephone Encounter (Signed)
 Plz make sure he is scheduled at the end of the year for a CPE (11/11 or beyond). Thx.

## 2024-10-02 NOTE — Telephone Encounter (Signed)
 Copied from CRM [id number]. Topic: Clinical - Medication Question >> Oct 02, 2024  3:48 PM David Gray wrote: Reason for CRM: pharmacy says they are out of stock for the adderall 20mg  but he wants to know if he can get a script for 30mg  since the pharmacy has that in stock

## 2024-10-02 NOTE — Telephone Encounter (Unsigned)
 Copied from CRM #[id number]. Topic: Clinical - Medication Refill >> Oct 02, 2024  1:26 PM Alfonso ORN wrote: Medication: amphetamine -dextroamphetamine  (ADDERALL) 20 MG tablet   Has the patient contacted their pharmacy? Yes   This is the patient's preferred pharmacy:  [id number] - [place], [place] - [address] [place]  [address] Phone: [phone] Fax: [phone]  Is this the correct pharmacy for this prescription? Yes   Has the prescription been filled recently? No  Is the patient out of the medication? Yes  Has the patient been seen for an appointment in the last year OR does the patient have an upcoming appointment? Yes  Can we respond through MyChart? Yes

## 2024-10-03 ENCOUNTER — Other Ambulatory Visit: Payer: Self-pay

## 2024-10-03 NOTE — Telephone Encounter (Signed)
 Called pharmacy twice on old for 15 min both times, will call back.

## 2024-11-07 ENCOUNTER — Encounter: Admitting: Family Medicine

## 2024-11-28 ENCOUNTER — Ambulatory Visit: Admitting: Family Medicine

## 2024-11-28 VITALS — BP 122/78 | HR 95 | Temp 98.0°F | Resp 16 | Ht 69.0 in | Wt 210.2 lb

## 2024-11-28 DIAGNOSIS — F902 Attention-deficit hyperactivity disorder, combined type: Secondary | ICD-10-CM | POA: Diagnosis not present

## 2024-11-28 DIAGNOSIS — H9202 Otalgia, left ear: Secondary | ICD-10-CM | POA: Diagnosis not present

## 2024-11-28 MED ORDER — AMPHETAMINE-DEXTROAMPHETAMINE 10 MG PO TABS: ORAL_TABLET | ORAL | 0 refills | Status: DC

## 2024-11-28 MED ORDER — MELOXICAM 15 MG PO TABS: 15.0000 mg | ORAL_TABLET | Freq: Every day | ORAL | 0 refills | Status: AC

## 2024-11-28 NOTE — Progress Notes (Signed)
 Chief Complaint  Patient presents with   Ear Pain    Ear Pain    David Gray is a 23 y.o. male here for a skin complaint.  Duration: 2 weeks Location: Left ear Pruritic? No Painful? Yes Drainage? No Trauma? 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 No Other associated symptoms: No consistent redness or swelling Therapies tried thus far: Ibuprofen  Patient is currently on Adderall XR 20 mg daily and compliance is excellent. Symptoms are well-controlled when he takes it but it seems to wear off in the early afternoon. Side effects include: None. Patient believes their dose should be increased. Denies tics, weight loss, difficulties with sleep, self-medication, alcohol/drug abuse, chest pain, or palpitations.  Past Medical History:  Diagnosis Date   ADHD (attention deficit hyperactivity disorder)    dx  at age 69 or 5    BP 122/78 (BP Location: Left Arm, Patient Position: Sitting)   Pulse 95   Temp 98 F (36.7 C) (Oral)   Resp 16   Ht 5' 9 (1.753 m)   Wt 210 lb 3.2 oz (95.3 kg)   SpO2 98%   BMI 31.04 kg/m  Gen: awake, alert, appearing stated age Lungs: No accessory muscle use Skin: Slight edema over the helix of the left ear with TTP. No drainage, erythema, fluctuance, excoriation Psych: Age appropriate judgment and insight  Left ear pain  Attention deficit hyperactivity disorder (ADHD), combined type - Plan: amphetamine -dextroamphetamine  (ADDERALL) 10 MG tablet  Likely trauma/inflammation.  Meloxicam daily as needed.  Ice, Tylenol, avoid trauma if possible.  No evidence of infection or hematoma. Chronic, not controlled.  Continue Adderall XR 20 mg daily.  Add short acting 10 mg dosage for the afternoon.  Follow-up in 1 month recheck. The patient voiced understanding and agreement to the plan.  Lycan Davee [place], DO 11/28/24 3:27 PM

## 2024-11-28 NOTE — Patient Instructions (Signed)
 OK to take Tylenol 1000 mg (2 extra strength tabs) or 975 mg (3 regular strength tabs) every 6 hours as needed.  Ice/cold pack over area for 10-15 min twice daily.  Let me know if anything changes.  Let us  know if you need anything.

## 2024-12-04 ENCOUNTER — Other Ambulatory Visit: Payer: Self-pay | Admitting: Family Medicine

## 2024-12-31 ENCOUNTER — Ambulatory Visit: Admitting: Family Medicine

## 2024-12-31 ENCOUNTER — Telehealth: Payer: Self-pay

## 2024-12-31 NOTE — Telephone Encounter (Signed)
 Pt has appt

## 2024-12-31 NOTE — Telephone Encounter (Signed)
 Initial Comment Caller states he is out of Adderall and needs a refill. Pt is out of medication and there is no refills. Symptoms are being out of focus and tired. Translation No Nurse Assessment Nurse: Geradine, RN, Sanjuanita Janie Date/Time (Eastern Time): 12/29/2024 2:53:50 PM You have opened med assessment, do you wish to continue? ---Yes Please select the assessment type ---Request for controlled medication refill Is there an on-call physician for the client? ---Yes Do the client directives specifically allow for paging the on-call regarding scheduled drugs? ---No Nurse: Geradine, RN, Sanjuanita Janie Date/Time (Eastern Time): 12/29/2024 2:54:27 PM Confirm and document reason for call. If symptomatic, describe symptoms. ---Caller states he is needing a refill of his Adderall, last dose was 2 days ago. States has been tired & not able to focus. Denies any other symptoms. Does the patient have any new or worsening symptoms? ---Yes Will a triage be completed? ---Yes Related visit to physician within the last 2 weeks? ---N/A Does the PT have any chronic conditions? (i.e. diabetes, asthma, this includes High risk factors for pregnancy, etc.) ---Unknown Is this a behavioral health or substance abuse call? ---No PLEASE NOTE: All timestamps contained within this report are represented as Eastern Standard Time. CONFIDENTIALTY NOTICE: This fax transmission is intended only for the addressee. It contains information that is legally privileged, confidential or otherwise protected from use or disclosure. If you are not the intended recipient, you are strictly prohibited from reviewing, disclosing, copying using or disseminating any of this information or taking any action in reliance on or regarding this information. If you have received this fax in error, please notify us  immediately by telephone so that we can arrange for its return to us . Phone: [phone], Toll-Free: [phone], Fax:  [phone] NICHOLAS_BUCKNAVAGE 06/03/2001 Page: 1 of2 CallId: [id number] Guidelines Guideline Title Affirmed Question Affirmed Notes Nurse Date/Time Titus Time) Attention Deficit Hyperactivity Disorder (ADHD) [1] Taking ADHD medicine prescribed by their PCP or other provider AND [2] requests a refill or has other non-urgent medication question Geradine, RN, Sanjuanita Janie 12/29/2024 2:51:34 PM Disp. Time Titus Time) Disposition Final User 12/29/2024 2:26:39 PM Send to Clinical Louis Darner, RN, Megan 12/29/2024 2:53:37 PM Call PCP when Office is Open Yes Geradine, RN, Sanjuanita Janie Final Disposition 12/29/2024 2:53:37 PM Call PCP when Office is Open Yes Cazares, RN, Sanjuanita Janie Caller Disagree/Comply Comply Caller Understands Yes PreDisposition Call Doctor Care Advice Given Per Guideline CALL PCP WHEN OFFICE IS OPEN: CARE ADVICE given per Attention Deficit Hyperactivity Disorder (ADHD) (Pediatric) guideline. CALL BACK IF: Referrals REFERRED TO PCP OFFICE

## 2025-01-01 ENCOUNTER — Ambulatory Visit: Admitting: Family Medicine

## 2025-01-01 ENCOUNTER — Encounter: Payer: Self-pay | Admitting: Family Medicine

## 2025-01-01 DIAGNOSIS — F902 Attention-deficit hyperactivity disorder, combined type: Secondary | ICD-10-CM | POA: Diagnosis not present

## 2025-01-01 MED ORDER — AMPHETAMINE-DEXTROAMPHETAMINE 20 MG PO TABS: 20.0000 mg | ORAL_TABLET | Freq: Every day | ORAL | 0 refills | Status: AC

## 2025-01-01 MED ORDER — AMPHETAMINE-DEXTROAMPHETAMINE 10 MG PO TABS: ORAL_TABLET | ORAL | 0 refills | Status: AC

## 2025-01-01 NOTE — Progress Notes (Signed)
 Chief Complaint  Patient presents with   Follow-up    Follow Up    David Gray is 23 y.o. male here for ADHD follow up.  Patient is currently on Adderall 20 mg/d, Adderall 10 mg in the PM prn and compliance is excellent. Symptoms are well controlled.  Side effects include: none. Patient believes their dose should be unchanged. Denies tics, weight loss, difficulties with sleep, self-medication, alcohol/drug abuse, chest pain, or palpitations.   Past Medical History:  Diagnosis Date   ADHD (attention deficit hyperactivity disorder)    dx  at age 70 or 5    BP 120/80 (BP Location: Left Arm, Patient Position: Sitting)   Pulse 73   Temp 98 F (36.7 C) (Oral)   Resp 16   Ht 5' 9 (1.753 m)   Wt 209 lb (94.8 kg)   SpO2 98%   BMI 30.86 kg/m  Gen- awake, alert, appearing stated age Heart- RRR Lungs- CTAB, no accessory muscle use Neuro- no facial tics Psych- age appropriate judgment and insight, normal mood and affect  Attention deficit hyperactivity disorder (ADHD), combined type - Plan: amphetamine -dextroamphetamine  (ADDERALL) 20 MG tablet, amphetamine -dextroamphetamine  (ADDERALL) 20 MG tablet, amphetamine -dextroamphetamine  (ADDERALL) 10 MG tablet  Chronic, stable. Cont Adderall 20 mg daily, 10 mg in PM prn.  F/u in 6 mo for CPE. Pt voiced understanding and agreement to the plan.  Bela Nyborg [place], DO 01/01/2025 2:44 PM

## 2025-01-22 ENCOUNTER — Telehealth: Payer: Self-pay | Admitting: Family Medicine

## 2025-01-22 DIAGNOSIS — Z91199 Patient's noncompliance with other medical treatment and regimen due to unspecified reason: Secondary | ICD-10-CM

## 2025-01-22 NOTE — Progress Notes (Signed)
Unable to get a hold of pt

## 2025-01-23 ENCOUNTER — Encounter: Payer: Self-pay | Admitting: Family Medicine

## 2025-01-23 ENCOUNTER — Ambulatory Visit: Admitting: Family Medicine

## 2025-01-23 VITALS — BP 120/78 | HR 89 | Temp 98.0°F | Resp 16 | Wt 207.8 lb

## 2025-01-23 DIAGNOSIS — N522 Drug-induced erectile dysfunction: Secondary | ICD-10-CM

## 2025-01-23 MED ORDER — TADALAFIL 20 MG PO TABS: 10.0000 mg | ORAL_TABLET | ORAL | 3 refills | Status: AC | PRN

## 2025-01-23 NOTE — Patient Instructions (Signed)
 Use GoodRx if insurance does not cover is not covered.  Let us  know if you need anything.

## 2025-01-23 NOTE — Progress Notes (Signed)
 Chief Complaint  Patient presents with   Erectile Dysfunction    Erectile Dysfunction    Subjective: Patient is a 24 y.o. male here for ED.  Pt is on hormone treatment as a bodybuilder. Has had issues attaining and maintaining an erection a few months. Libido is normal. Has been on Cialis  10 mg in the past and requesting a refill.   Past Medical History:  Diagnosis Date   ADHD (attention deficit hyperactivity disorder)    dx  at age 48 or 5    Objective: BP 120/78 (BP Location: Left Arm, Patient Position: Sitting)   Pulse 89   Temp 98 F (36.7 C) (Oral)   Resp 16   Wt 207 lb 12.8 oz (94.3 kg)   SpO2 98%   BMI 30.69 kg/m  General: Awake, appears stated age Heart: RRR, no LE edema Lungs: CTAB, no rales, wheezes or rhonchi. No accessory muscle use Psych: Age appropriate judgment and insight, normal affect and mood  Assessment and Plan: Drug-induced erectile dysfunction - Plan: tadalafil  (CIALIS ) 20 MG tablet  Ins reportedly covers this. Consider GoodRx if not covered. Warned about priapism. Fu as originally scheduled.   The patient voiced understanding and agreement to the plan.  Dannon Nguyenthi [place], DO 01/23/25  1:34 PM

## 2025-07-01 ENCOUNTER — Encounter: Admitting: Family Medicine
